# Patient Record
Sex: Male | Born: 1940 | Race: Black or African American | Hispanic: No | Smoking: Former smoker
Health system: Southern US, Community
[De-identification: ages and names within clinical notes are randomized; demographics above are authoritative.]

## PROBLEM LIST (undated history)

## (undated) DIAGNOSIS — I10 Essential (primary) hypertension: Secondary | ICD-10-CM

## (undated) DIAGNOSIS — F0390 Unspecified dementia without behavioral disturbance: Secondary | ICD-10-CM

## (undated) DIAGNOSIS — I739 Peripheral vascular disease, unspecified: Secondary | ICD-10-CM

## (undated) DIAGNOSIS — I96 Gangrene, not elsewhere classified: Secondary | ICD-10-CM

---

## 1998-05-01 ENCOUNTER — Emergency Department (HOSPITAL_COMMUNITY): Admission: EM | Admit: 1998-05-01 | Discharge: 1998-05-01 | Payer: Self-pay | Admitting: Emergency Medicine

## 2002-08-18 ENCOUNTER — Encounter: Payer: Self-pay | Admitting: Otolaryngology

## 2002-08-18 ENCOUNTER — Encounter: Admission: RE | Admit: 2002-08-18 | Discharge: 2002-08-18 | Payer: Self-pay | Admitting: Otolaryngology

## 2005-07-18 ENCOUNTER — Emergency Department (HOSPITAL_COMMUNITY): Admission: EM | Admit: 2005-07-18 | Discharge: 2005-07-18 | Payer: Self-pay | Admitting: Emergency Medicine

## 2005-07-28 ENCOUNTER — Ambulatory Visit: Payer: Self-pay | Admitting: Family Medicine

## 2005-09-09 ENCOUNTER — Emergency Department (HOSPITAL_COMMUNITY): Admission: EM | Admit: 2005-09-09 | Discharge: 2005-09-09 | Payer: Self-pay | Admitting: Family Medicine

## 2005-12-17 ENCOUNTER — Ambulatory Visit: Payer: Self-pay | Admitting: Family Medicine

## 2006-07-19 ENCOUNTER — Ambulatory Visit: Payer: Self-pay | Admitting: Internal Medicine

## 2006-08-02 ENCOUNTER — Emergency Department (HOSPITAL_COMMUNITY): Admission: EM | Admit: 2006-08-02 | Discharge: 2006-08-02 | Payer: Self-pay | Admitting: Emergency Medicine

## 2009-09-04 ENCOUNTER — Ambulatory Visit: Payer: Self-pay | Admitting: Physician Assistant

## 2009-09-04 DIAGNOSIS — I10 Essential (primary) hypertension: Secondary | ICD-10-CM | POA: Insufficient documentation

## 2009-09-04 DIAGNOSIS — J069 Acute upper respiratory infection, unspecified: Secondary | ICD-10-CM | POA: Insufficient documentation

## 2009-09-05 ENCOUNTER — Encounter: Payer: Self-pay | Admitting: Physician Assistant

## 2010-06-22 LAB — CONVERTED CEMR LAB
Albumin: 4.6 g/dL (ref 3.5–5.2)
Alkaline Phosphatase: 83 units/L (ref 39–117)
BUN: 15 mg/dL (ref 6–23)
Basophils Absolute: 0 10*3/uL (ref 0.0–0.1)
Calcium: 9.7 mg/dL (ref 8.4–10.5)
Eosinophils Relative: 9 % — ABNORMAL HIGH (ref 0–5)
Glucose, Bld: 79 mg/dL (ref 70–99)
HCT: 42.5 % (ref 39.0–52.0)
Hemoglobin: 14.1 g/dL (ref 13.0–17.0)
Lymphocytes Relative: 28 % (ref 12–46)
MCHC: 33.2 g/dL (ref 30.0–36.0)
MCV: 90.4 fL (ref 78.0–100.0)
Monocytes Absolute: 0.5 10*3/uL (ref 0.1–1.0)
Potassium: 4.3 meq/L (ref 3.5–5.3)
RDW: 15.2 % (ref 11.5–15.5)

## 2010-06-24 NOTE — Letter (Signed)
Summary: *HSN Results Follow up  [facility]  [address] [place], [place] [address]   Phone: [phone]  Fax: [phone]      09/05/2009   MURRIEL EIDEM [address] [place], [place]  [address]   Dear  Mr. Obaloluwa Laverdiere,                            ____S.Drinkard,FNP   ____D. Gore,FNP       ____B. McPherson,MD   ____V. Rankins,MD    ____E. Mulberry,MD    ____N. Daphine Deutscher, FNP  ____D. Reche Dixon, MD    ____K. Philipp Deputy, MD    __x__S. Alben Spittle, PA-C     This letter is to inform you that your recent test(s):  _______Pap Smear    ___x____Lab Test     _______X-ray    ___x____ is within acceptable limits  _______ requires a medication change  _______ requires a follow-up lab visit  _______ requires a follow-up visit with your provider   Comments:       _________________________________________________________ If you have any questions, please contact our office                     Sincerely,  Tereso Newcomer PA-C [facility]

## 2010-06-24 NOTE — Assessment & Plan Note (Signed)
Summary: Seth West PT//HTN////KT   Vital Signs:  Patient profile:   70 year old male Weight:      162.50 pounds Temp:     98 degrees F Pulse (ortho):   98 / minute Pulse rhythm:   regular Resp:     20 per minute BP sitting:   143 / 97  (right arm)  Vitals Entered By: Chauncy Passy, SMA CC: Pt. is here for HTN management. Pt. is also complaining of chest pains associated w/coughing that started today in the morning. Pt. when to the dentis on Monday and they prescribed him amoxicillin for an infection. , Hypertension Management Is Patient Diabetic? No Pain Assessment Patient in pain? yes     Location: chest Intensity: 2 Onset of pain  Intermittent  Does patient need assistance? Functional Status Self care Ambulation Normal   Primary Care Provider:  Tereso Newcomer, PA-C  CC:  Pt. is here for HTN management. Pt. is also complaining of chest pains associated w/coughing that started today in the morning. Pt. when to the dentis on Monday and they prescribed him amoxicillin for an infection.  and Hypertension Management.  History of Present Illness: Re-establish.  Not seen since 2008. Saw Dr. Barbaraann West in the past.  Remembers seeing Dr. Reche Dixon. Here with daughter [name].  HTN:  Out of BP meds for about 2 years.  Saw dentist recently and was noted to have very high BP.  No recording given today.  Decided to set up appt.  Dental:  Saw dentist and put on Amox.  Supposed to get teeth pulled but waiting on taking meds first.  Sees dentist back next week.  Amox start this week.  Cough:  Started today.  No fever.  Sputum white.  No hemoptysis.  No dyspnea.  Does have chest discomfort with cough. No sore throat or otalgia.  No wheezing.    Hypertension History:      He denies headache, orthopnea, and syncope.        Positive major cardiovascular risk factors include male age 44 years old or older and hypertension.  Negative major cardiovascular risk factors include non-tobacco-user status.      Habits & Providers  Alcohol-Tobacco-Diet     Alcohol drinks/day: 1 quart every day     Alcohol type: beer     Needs 'eye opener' in am: no     Tobacco Status: quit     Year Quit: 2010     Pack years: 20 +  Exercise-Depression-Behavior     Drug Use: no  Current Medications (verified): 1)  None  Allergies (verified): No Known Drug Allergies  Past History:  Past Medical History: Hypertension  Past Surgical History: Denies surgical history  Family History: Cousin - ? cancer Daughter - Hodgkins Lymphoma  Social History: Retired Former Smoker Alcohol use-yes Drug use-no Smoking Status:  quit Drug Use:  no  Review of Systems      See HPI GI:  Denies bloody stools. GU:  Denies hematuria.  Physical Exam  General:  alert, well-developed, and well-nourished.   Head:  normocephalic and atraumatic.   Eyes:  pupils equal, pupils round, pupils reactive to light, and no optic disk abnormalities.   Ears:  R ear normal and L ear normal.   Nose:  no external deformity.   Mouth:  pharynx pink and moist and no exudates.   Neck:  supple and no cervical lymphadenopathy.   Lungs:  normal breath sounds, no crackles, and no wheezes.   Heart:  normal rate and regular rhythm.   Abdomen:  soft and non-tender.   Neurologic:  alert & oriented X3 and cranial nerves II-XII intact.   Psych:  normally interactive.     Impression & Recommendations:  Problem # 1:  UPPER RESPIRATORY INFECTION (ICD-465.9)  rest  fluids tessalon perles as needed already on antibx for teeth . . . not needed for URI  His updated medication list for this problem includes:    Tessalon Perles 100 Mg Caps (Benzonatate) .Marland Kitchen... Take 1 tablet by mouth three times a day as needed for cough  Problem # 2:  HYPERTENSION (ICD-401.9)  start cardizem 120 mg once daily  Orders: EKG w/ Interpretation (93000) T-Comprehensive Metabolic Panel ([address]25-36644) T-CBC w/Diff (03474-25956) T-TSH ([address]756-43329)  His  updated medication list for this problem includes:    Cardizem 120 Mg Tabs (Diltiazem hcl) .Marland Kitchen... Take 1 tablet by mouth once a day for blood pressure  Problem # 3:  Preventive Health Care (ICD-V70.0) arrange CPE  Complete Medication List: 1)  Cardizem 120 Mg Tabs (Diltiazem hcl) .... Take 1 tablet by mouth once a day for blood pressure 2)  Tessalon Perles 100 Mg Caps (Benzonatate) .... Take 1 tablet by mouth three times a day as needed for cough  Hypertension Assessment/Plan:      The patient's hypertensive risk group is category B: At least one risk factor (excluding diabetes) with no target organ damage.  Today's blood pressure is 143/97.    Patient Instructions: 1)  Please schedule a follow-up appointment in 2 months with Ikaika Showers for CPE (come fasting for bloodwork). Do not eat or drink anything after midnight except water. 2)  Drink plenty of fluids. 3)  Get plenty of rest. 4)  Take tessalon perles three times a day as needed for cough. 5)  Use tylenol for aches and pains or fever. 6)  Take 650 - 1000 mg of tylenol every 4-6 hours as needed for relief of pain or comfort of fever. Avoid taking more than 4000 mg in a 24 hour period( can cause liver damage in higher doses).  7)  Go to emergency room if you develop a fever of 101 or higher. Prescriptions: TESSALON PERLES 100 MG CAPS (BENZONATATE) Take 1 tablet by mouth three times a day as needed for cough  #30 x 1   Entered and Authorized by:   Tereso Newcomer PA-C   Signed by:   Tereso Newcomer PA-C on 09/04/2009   Method used:   Print then Give to Patient   RxID:   [id number] CARDIZEM 120 MG TABS (DILTIAZEM HCL) Take 1 tablet by mouth once a day for blood pressure  #30 x 5   Entered and Authorized by:   Tereso Newcomer PA-C   Signed by:   Tereso Newcomer PA-C on 09/04/2009   Method used:   Print then Give to Patient   RxID:   [id number]    EKG  Procedure date:  09/04/2009  Findings:      Normal sinus rhythm with rate of:   93 normal axis LVH nonspecific ST-TW abnormality PR int 140 ms

## 2013-06-25 DIAGNOSIS — I96 Gangrene, not elsewhere classified: Secondary | ICD-10-CM

## 2013-06-25 HISTORY — DX: Gangrene, not elsewhere classified: I96

## 2013-07-05 ENCOUNTER — Encounter (HOSPITAL_COMMUNITY): Payer: Self-pay | Admitting: Emergency Medicine

## 2013-07-05 ENCOUNTER — Emergency Department (HOSPITAL_COMMUNITY): Payer: Medicare Other

## 2013-07-05 ENCOUNTER — Inpatient Hospital Stay (HOSPITAL_COMMUNITY)
Admission: EM | Admit: 2013-07-05 | Discharge: 2013-07-12 | DRG: 239 | Disposition: A | Discharge status: Skilled Nursing Facility | Payer: Medicare Other | Attending: Internal Medicine | Admitting: Internal Medicine

## 2013-07-05 DIAGNOSIS — E875 Hyperkalemia: Secondary | ICD-10-CM | POA: Diagnosis not present

## 2013-07-05 DIAGNOSIS — D62 Acute posthemorrhagic anemia: Secondary | ICD-10-CM | POA: Diagnosis not present

## 2013-07-05 DIAGNOSIS — Z87891 Personal history of nicotine dependence: Secondary | ICD-10-CM

## 2013-07-05 DIAGNOSIS — G547 Phantom limb syndrome without pain: Secondary | ICD-10-CM | POA: Diagnosis not present

## 2013-07-05 DIAGNOSIS — L97509 Non-pressure chronic ulcer of other part of unspecified foot with unspecified severity: Secondary | ICD-10-CM | POA: Diagnosis present

## 2013-07-05 DIAGNOSIS — I998 Other disorder of circulatory system: Secondary | ICD-10-CM

## 2013-07-05 DIAGNOSIS — E872 Acidosis: Secondary | ICD-10-CM | POA: Diagnosis present

## 2013-07-05 DIAGNOSIS — L0390 Cellulitis, unspecified: Secondary | ICD-10-CM

## 2013-07-05 DIAGNOSIS — I10 Essential (primary) hypertension: Secondary | ICD-10-CM | POA: Diagnosis present

## 2013-07-05 DIAGNOSIS — N170 Acute kidney failure with tubular necrosis: Secondary | ICD-10-CM | POA: Diagnosis not present

## 2013-07-05 DIAGNOSIS — M79609 Pain in unspecified limb: Secondary | ICD-10-CM

## 2013-07-05 DIAGNOSIS — I739 Peripheral vascular disease, unspecified: Principal | ICD-10-CM

## 2013-07-05 DIAGNOSIS — I96 Gangrene, not elsewhere classified: Secondary | ICD-10-CM

## 2013-07-05 DIAGNOSIS — N179 Acute kidney failure, unspecified: Secondary | ICD-10-CM

## 2013-07-05 DIAGNOSIS — R7402 Elevation of levels of lactic acid dehydrogenase (LDH): Secondary | ICD-10-CM

## 2013-07-05 DIAGNOSIS — E8729 Other acidosis: Secondary | ICD-10-CM | POA: Diagnosis present

## 2013-07-05 DIAGNOSIS — R7401 Elevation of levels of liver transaminase levels: Secondary | ICD-10-CM | POA: Diagnosis present

## 2013-07-05 DIAGNOSIS — A419 Sepsis, unspecified organism: Secondary | ICD-10-CM | POA: Diagnosis present

## 2013-07-05 DIAGNOSIS — I959 Hypotension, unspecified: Secondary | ICD-10-CM | POA: Diagnosis not present

## 2013-07-05 DIAGNOSIS — M7989 Other specified soft tissue disorders: Secondary | ICD-10-CM

## 2013-07-05 DIAGNOSIS — R7989 Other specified abnormal findings of blood chemistry: Secondary | ICD-10-CM | POA: Diagnosis present

## 2013-07-05 DIAGNOSIS — R740 Nonspecific elevation of levels of transaminase and lactic acid dehydrogenase [LDH]: Secondary | ICD-10-CM

## 2013-07-05 HISTORY — DX: Peripheral vascular disease, unspecified: I73.9

## 2013-07-05 HISTORY — DX: Essential (primary) hypertension: I10

## 2013-07-05 HISTORY — DX: Gangrene, not elsewhere classified: I96

## 2013-07-05 LAB — COMPREHENSIVE METABOLIC PANEL
ALBUMIN: 3 g/dL — AB (ref 3.5–5.2)
ALT: 97 U/L — ABNORMAL HIGH (ref 0–53)
AST: 80 U/L — AB (ref 0–37)
Alkaline Phosphatase: 232 U/L — ABNORMAL HIGH (ref 39–117)
BILIRUBIN TOTAL: 1 mg/dL (ref 0.3–1.2)
BUN: 23 mg/dL (ref 6–23)
CALCIUM: 10.3 mg/dL (ref 8.4–10.5)
CO2: 22 mEq/L (ref 19–32)
CREATININE: 1.08 mg/dL (ref 0.50–1.35)
Chloride: 96 mEq/L (ref 96–112)
GFR calc Af Amer: 76 mL/min — ABNORMAL LOW (ref >90)
GFR calc non Af Amer: 66 mL/min — ABNORMAL LOW (ref >90)
Glucose, Bld: 94 mg/dL (ref 70–99)
Potassium: 4.3 mEq/L (ref 3.7–5.3)
Sodium: 136 mEq/L — ABNORMAL LOW (ref 137–147)
TOTAL PROTEIN: 9.6 g/dL — AB (ref 6.0–8.3)

## 2013-07-05 LAB — CBC WITH DIFFERENTIAL/PLATELET
BASOS ABS: 0 10*3/uL (ref 0.0–0.1)
BASOS PCT: 0 % (ref 0–1)
EOS ABS: 0 10*3/uL (ref 0.0–0.7)
EOS PCT: 0 % (ref 0–5)
HEMATOCRIT: 39.5 % (ref 39.0–52.0)
HEMOGLOBIN: 14.6 g/dL (ref 13.0–17.0)
Lymphocytes Relative: 12 % (ref 12–46)
Lymphs Abs: 1.9 10*3/uL (ref 0.7–4.0)
MCH: 33.1 pg (ref 26.0–34.0)
MCHC: 37 g/dL — AB (ref 30.0–36.0)
MCV: 89.6 fL (ref 78.0–100.0)
MONO ABS: 1.2 10*3/uL — AB (ref 0.1–1.0)
MONOS PCT: 7 % (ref 3–12)
Neutro Abs: 12.8 10*3/uL — ABNORMAL HIGH (ref 1.7–7.7)
Neutrophils Relative %: 81 % — ABNORMAL HIGH (ref 43–77)
Platelets: 378 10*3/uL (ref 150–400)
RBC: 4.41 MIL/uL (ref 4.22–5.81)
RDW: 12.8 % (ref 11.5–15.5)
WBC: 15.9 10*3/uL — ABNORMAL HIGH (ref 4.0–10.5)

## 2013-07-05 LAB — CG4 I-STAT (LACTIC ACID): Lactic Acid, Venous: 1.68 mmol/L (ref 0.5–2.2)

## 2013-07-05 LAB — ETHANOL: Alcohol, Ethyl (B): <11 mg/dL (ref 0–11)

## 2013-07-05 LAB — CK: Total CK: 281 U/L — ABNORMAL HIGH (ref 7–232)

## 2013-07-05 MED ORDER — HEPARIN SODIUM (PORCINE) 5000 UNIT/ML IJ SOLN
5000.0000 [IU] | Freq: Three times a day (TID) | INTRAMUSCULAR | Status: DC
  Administered 2013-07-05 – 2013-07-12 (×17): 5000 [IU] via SUBCUTANEOUS
  Filled 2013-07-05 (×24): qty 1

## 2013-07-05 MED ORDER — PIPERACILLIN-TAZOBACTAM 3.375 G IVPB 30 MIN
3.3750 g | Freq: Once | INTRAVENOUS | Status: AC
  Administered 2013-07-05: 3.375 g via INTRAVENOUS
  Filled 2013-07-05 (×2): qty 50

## 2013-07-05 MED ORDER — DOCUSATE SODIUM 100 MG PO CAPS
100.0000 mg | ORAL_CAPSULE | Freq: Two times a day (BID) | ORAL | Status: DC
  Administered 2013-07-05 – 2013-07-12 (×14): 100 mg via ORAL
  Filled 2013-07-05 (×17): qty 1

## 2013-07-05 MED ORDER — SODIUM CHLORIDE 0.9 % IV SOLN
INTRAVENOUS | Status: AC
  Administered 2013-07-05: 500 mL via INTRAVENOUS
  Administered 2013-07-06: 05:00:00 via INTRAVENOUS

## 2013-07-05 MED ORDER — METOPROLOL TARTRATE 25 MG PO TABS
25.0000 mg | ORAL_TABLET | Freq: Two times a day (BID) | ORAL | Status: DC
  Administered 2013-07-05 – 2013-07-12 (×14): 25 mg via ORAL
  Filled 2013-07-05 (×16): qty 1

## 2013-07-05 MED ORDER — PIPERACILLIN-TAZOBACTAM 3.375 G IVPB
3.3750 g | Freq: Three times a day (TID) | INTRAVENOUS | Status: DC
  Administered 2013-07-05 – 2013-07-09 (×11): 3.375 g via INTRAVENOUS
  Filled 2013-07-05 (×15): qty 50

## 2013-07-05 MED ORDER — VANCOMYCIN HCL 10 G IV SOLR
1250.0000 mg | INTRAVENOUS | Status: DC
  Administered 2013-07-05 – 2013-07-07 (×3): 1250 mg via INTRAVENOUS
  Filled 2013-07-05 (×4): qty 1250

## 2013-07-05 MED ORDER — BIOTENE DRY MOUTH MT LIQD
15.0000 mL | Freq: Two times a day (BID) | OROMUCOSAL | Status: DC
  Administered 2013-07-05 – 2013-07-12 (×12): 15 mL via OROMUCOSAL

## 2013-07-05 MED ORDER — OXYCODONE HCL 5 MG PO TABS
5.0000 mg | ORAL_TABLET | ORAL | Status: DC | PRN
  Administered 2013-07-06 – 2013-07-12 (×5): 5 mg via ORAL
  Filled 2013-07-05 (×5): qty 1

## 2013-07-05 NOTE — ED Notes (Signed)
Lactic acid results shown to Dr. Pollina 

## 2013-07-05 NOTE — Progress Notes (Addendum)
ANTIBIOTIC CONSULT NOTE - INITIAL  Pharmacy Consult for Vancomycin and Zosyn Indication: Cellulitis  No Known Allergies  Patient Measurements:  Weight: 70kg (patient estimate)  Vital Signs: Temp: 98.4 F (36.9 C) (02/11 1219) Temp src: Oral (02/11 1219) BP: 157/82 mmHg (02/11 1500) Pulse Rate: 109 (02/11 1500) Intake/Output from previous day:   Intake/Output from this shift:    Labs:  Recent Labs  07/05/13 1328  WBC 15.9*  HGB 14.6  PLT 378  CREATININE 1.08   CrCl is unknown because there is no height on file for the current visit. No results found for this basename: VANCOTROUGH, VANCOPEAK, VANCORANDOM, GENTTROUGH, GENTPEAK, GENTRANDOM, TOBRATROUGH, TOBRAPEAK, TOBRARND, AMIKACINPEAK, AMIKACINTROU, AMIKACIN,  in the last 72 hours   Microbiology: No results found for this or any previous visit (from the past 720 hour(s)).  Medical History: Past Medical History  Diagnosis Date  . Hypertension     has been off meds for years.     Medications:  See electronic med rec  Assessment: 72yom to start Vancomycin and Zosyn for RLE ulcer/cellulitis.  - Afebrile, WBC ~16 - CrCl ~58  Goal of Therapy:  Vancomycin trough level 10-15 mcg/ml  Plan:  1. Vancomycin 1.25g IV q24h 2. Zosyn 3.375g IV q8h - infuse over 4 hours 3. Follow-up antibiotic plan, renal function, cultures and [facility]  [place], [place] [phone] 07/05/2013,4:21 PM

## 2013-07-05 NOTE — Progress Notes (Signed)
Pt admitted to [facility], received report from [place], [place]. Pt is alert and oriented to staff, call bell, and room. Bed in lowest position. Call bell within reach. Full assessment to Epic. Will continue to monitor. Fayne NorrieMackenzie Annalyse Langlais, RN

## 2013-07-05 NOTE — Consult Note (Signed)
[facility] CONSULT [facility]   MRN : [id number]  Reason for Consult: Right foot ulcer with Chronic ishemia Referring Physician: ED  History of Present Illness: 73 y/o male has reported having a foot wound that has progressed over the past 2 weeks and his daughter called EMS to bring him to the hospital today.  He has been washing the foot with alcohol.  He denise DM, stroke, or hyperlipidemia.  He does have hypertension according to his daughter, but does not take any medications.  He does not go to the doctor on a regular basis. He has a history of smoking.     Current Facility-Administered Medications  Medication Dose Route Frequency Provider Last Rate Last Dose  . piperacillin-tazobactam (ZOSYN) IVPB 3.375 g  3.375 g Intravenous Once Gilda Creasehristopher J. Pollina, MD       No current outpatient prescriptions on file.    Pt meds include: Statin :No Betablocker: No ASA: No Other anticoagulants/antiplatelets:   Past Medical History  Diagnosis Date  . Hypertension     has been off meds for years.     History reviewed. No pertinent past surgical history. Dental procedures  Social History History  Substance Use Topics  . Smoking status: Not on file  . Smokeless tobacco: Not on file  . Alcohol Use: Not on file    Family History Cousin - ? cancer  Daughter - Hodgkins Lymphoma  No Known Allergies   REVIEW OF SYSTEMS  General: [ ]  Weight loss, [ ]  Fever, [ ]  chills Neurologic: [ ]  Dizziness, [ ]  Blackouts, [ ]  Seizure [ ]  Stroke, [ ]  "Mini stroke", [ ]  Slurred speech, [ ]  Temporary blindness; [ ]  weakness in arms or legs, [ ]  Hoarseness [ ]  Dysphagia Cardiac: [ ]  Chest pain/pressure, [ ]  Shortness of breath at rest [ ]  Shortness of breath with exertion, [ ]  Atrial fibrillation or irregular heartbeat  Vascular: [ ]  Pain in legs with walking, [x ] Pain in legs at rest, [ ]  Pain in legs at night,  [ ]  Non-healing ulcer, [ ]  Blood clot in vein/DVT,    Pulmonary: [ ]  Home oxygen, [ ]  Productive cough, [ ]  Coughing up blood, [ ]  Asthma,  [ ]  Wheezing [ ]  COPD Musculoskeletal:  [ ]  Arthritis, [ ]  Low back pain, [ ]  Joint pain Hematologic: [ ]  Easy Bruising, [ ]  Anemia; [ ]  Hepatitis Gastrointestinal: [ ]  Blood in stool, [ ]  Gastroesophageal Reflux/heartburn, Urinary: [ ]  chronic Kidney disease, [ ]  on HD - [ ]  MWF or [ ]  TTHS, [ ]  Burning with urination, [ ]  Difficulty urinating Skin: [ ]  Rashes, [x ] Wounds  Psychological: [ ]  Anxiety, [ ]  Depression  Physical Examination Filed Vitals:   07/05/13 1219 07/05/13 1500  BP: 160/78 157/82  Pulse: 114 109  Temp: 98.4 F (36.9 C)   TempSrc: Oral   Resp: 18 18  SpO2: 97% 99%   There is no height or weight on file to calculate BMI.  General:  NAD HENT: WNL Eyes: Pupils equal, cataracts Pulmonary: normal non-labored breathing , without Rales, rhonchi,  wheezing Cardiac: RRR, without  Murmurs, rubs or gallops; No carotid bruits Abdomen: soft, NT, no masses Skin: no rashes, ulcers noted;  positve Gangrene , positive cellulitis; positive open wounds;   Right foot and leg are erythematous with edema, plantar foot blister, black necrotic toes skin feels warm to touch.  Vascular Exam/Pulses:Femoral pulses palpable, no popliteal.  Doppler faint  AT signal bil. Radial pulses palpable bil.   Musculoskeletal: positive muscle wasting left foot and ankle with atrophy.  Neurologic: A&O X 3; Appropriate Affect ;  SENSATION: decreased bil. LE Right > left MOTOR FUNCTION: limited exam Bil. LE, Upper ext. 5/5 grossly equal Speech is fluent/normal   Significant Diagnostic Studies: CBC Lab Results  Component Value Date   WBC 15.9* 07/05/2013   HGB 14.6 07/05/2013   HCT 39.5 07/05/2013   MCV 89.6 07/05/2013   PLT 378 07/05/2013    BMET    Component Value Date/Time   NA 136* 07/05/2013 1328   K 4.3 07/05/2013 1328   CL 96 07/05/2013 1328   CO2 22 07/05/2013 1328   GLUCOSE 94 07/05/2013 1328    BUN 23 07/05/2013 1328   CREATININE 1.08 07/05/2013 1328   CALCIUM 10.3 07/05/2013 1328   GFRNONAA 66* 07/05/2013 1328   GFRAA 76* 07/05/2013 1328   CrCl is unknown because there is no height on file for the current visit.  COAG No results found for this basename: INR, PROTIME     Non-Invasive Vascular Imaging:  Pending ABI, venous duplex  ASSESSMENT/PLAN:  Infected/chronic ischemic right foot It is likely he with need a right BKA verses AKA.   Clinton Gallant [facility] 07/05/2013 3:25 PM  Agree with above assessment Patient states this has been present for 2 weeks but probably much longer He has 3+ popliteal pulse palpable in right lower extremity with nonsalvageable right foot  Needs right below knee amputation-discussed with patient and his daughter Patient on vancomycin and Zosyn Plan surgery for Friday morning

## 2013-07-05 NOTE — Progress Notes (Signed)
*  PRELIMINARY RESULTS* Vascular Ultrasound Right lower extremity venous duplex has been completed.  Preliminary findings: no evidence of DVT.  ABI completed:   RIGHT    LEFT    PRESSURE WAVEFORM  PRESSURE WAVEFORM  BRACHIAL 180 Tri BRACHIAL 182 Tri  DP   DP    AT 97 Mono  AT 130 Mono   PT 92 Mono  PT unaudible   PER   PER    GREAT TOE  NA GREAT TOE  NA    RIGHT LEFT  ABI 0.53 0.71    Farrel DemarkJill Eunice, RDMS, RVT  07/05/2013, 4:13 PM

## 2013-07-05 NOTE — ED Notes (Signed)
[phone] Delray AltMargie (daughter) to be updated once pt has room assignment.

## 2013-07-05 NOTE — ED Notes (Signed)
Per EMS pt came from home with wound to right foot. Pt reports wound has been there for approximately 2 weeks but family reports possibly 2 months. Foot is white on top, 2nd and 3rd toe are black, there is a large blister to the bottom of the foot. Pitting edema extends to knee. Family reports pt has hx of HTN but has been off medication for years, family is unsure of hx of DM.

## 2013-07-05 NOTE — ED Provider Notes (Signed)
CSN: [id number]     Arrival date & time 07/05/13  1209 History   First MD Initiated Contact with Patient 07/05/13 1211     Chief Complaint  Patient presents with  . Wound Infection     (Consider location/radiation/quality/duration/timing/severity/associated sxs/prior Treatment) HPI Comments: Patient presents to the ER from home for evaluation of a wound on his right foot. Patient reports that he injured his foot several weeks ago. He says he bumped it on a piece of furniture. The patient is an extremely vague historian and getting the complete history is very difficult.  The patient's family apparently became concerned about his foot today when they saw her and called an ambulance. Patient reports a large wound on the top of his foot with color changes of the toes. He denies pain, however.   Patient  Past Medical History  Diagnosis Date  . Hypertension     has been off meds for years.    History reviewed. No pertinent past surgical history. No family history on file. History  Substance Use Topics  . Smoking status: Not on file  . Smokeless tobacco: Not on file  . Alcohol Use: Not on file    Review of Systems  Respiratory: Negative for shortness of breath.   Cardiovascular: Negative for chest pain.  Skin: Positive for color change and wound.  All other systems reviewed and are negative.      Allergies  Review of patient's allergies indicates no known allergies.  Home Medications  No current outpatient prescriptions on file. BP 157/82  Pulse 109  Temp(Src) 98.4 F (36.9 C) (Oral)  Resp 18  SpO2 99% Physical Exam  Constitutional: He is oriented to person, place, and time. He appears well-developed and well-nourished. No distress.  HENT:  Head: Normocephalic and atraumatic.  Right Ear: Hearing normal.  Left Ear: Hearing normal.  Nose: Nose normal.  Mouth/Throat: Oropharynx is clear and moist and mucous membranes are normal.  Eyes: Conjunctivae and EOM are  normal. Pupils are equal, round, and reactive to light.  Neck: Normal range of motion. Neck supple.  Cardiovascular: Regular rhythm, S1 normal and S2 normal.  Exam reveals no gallop and no friction rub.   No murmur heard. Pulses:      Dorsalis pedis pulses are 0 on the right side.  Right DP not palpable and can not find with doppler Right PT not palpable, faintly present with doppler  Pulmonary/Chest: Effort normal and breath sounds normal. No respiratory distress. He exhibits no tenderness.  Abdominal: Soft. Normal appearance and bowel sounds are normal. There is no hepatosplenomegaly. There is no tenderness. There is no rebound, no guarding, no tenderness at McBurney's point and negative Murphy's sign. No hernia.  Musculoskeletal: Normal range of motion. He exhibits edema (right leg).  Neurological: He is alert and oriented to person, place, and time. He has normal strength. No cranial nerve deficit or sensory deficit. Coordination normal. GCS eye subscore is 4. GCS verbal subscore is 5. GCS motor subscore is 6.  Skin: Skin is warm, dry and intact. No rash noted. No cyanosis.  Document second, third, fourth, fifth toes on the right foot with dorsal foot wound. Malodorous drainage present. Erythema present as well, but no warmth, foot is cool to touch.  Psychiatric: He has a normal mood and affect. His speech is normal and behavior is normal. Thought content normal.    ED Course  Procedures (including critical care time) Labs Review Labs Reviewed  CBC WITH DIFFERENTIAL - Abnormal;  Notable for the following:    WBC 15.9 (*)    MCHC 37.0 (*)    Neutrophils Relative % 81 (*)    Neutro Abs 12.8 (*)    Monocytes Absolute 1.2 (*)    All other components within normal limits  COMPREHENSIVE METABOLIC PANEL - Abnormal; Notable for the following:    Sodium 136 (*)    Total Protein 9.6 (*)    Albumin 3.0 (*)    AST 80 (*)    ALT 97 (*)    Alkaline Phosphatase 232 (*)    GFR calc non Af Amer  66 (*)    GFR calc Af Amer 76 (*)    All other components within normal limits  CK - Abnormal; Notable for the following:    Total CK 281 (*)    All other components within normal limits  CULTURE, BLOOD (ROUTINE X 2)  CULTURE, BLOOD (ROUTINE X 2)  CG4 I-STAT (LACTIC ACID)   Imaging Review Dg Foot Complete Right  07/05/2013   CLINICAL DATA:  Wound infection.  EXAM: RIGHT FOOT COMPLETE - 3+ VIEW  COMPARISON:  None.  FINDINGS: There is no fracture or dislocation or bone destruction. Minimal degenerative changes are present at the first metatarsal phalangeal joint. Osseous structures are otherwise normal. Small vessel calcification is noted in the foot. Minimal osteophyte formation at the anterior aspect of the distal tibia.  IMPRESSION: No acute abnormality. Specifically, no findings suggestive of osteomyelitis.   Electronically Signed   By: Geanie CooleyJim  Maxwell M.D.   On: 07/05/2013 14:02    EKG Interpretation   None              MDM   Final diagnoses:  Gangrene of toe  Cellulitis    Patient presents to the ER for evaluation of wound on the right foot. Examination reveals gangrene of second through fifth toes of the right foot with significant swelling, erythema associated with infection. He also has significantly diminished blood flow to the leg. Vascular surgery has been constant, patient will have ABIs and venous duplex. He will be admitted to the medicine service for antibiotics and further management.    Gilda Creasehristopher J. Pollina, MD 07/05/13 1536

## 2013-07-05 NOTE — H&P (Signed)
Date: 07/05/2013               Patient Name:  Seth West MRN: [id number]  DOB: [birth year] Age / Sex: 73 y.o., male   PCP: Pcp Not In System         Medical Service: Internal Medicine Teaching Service         Attending Physician: Dr. Karren Cobble, MD    First Contact: Dr. Ronnald Ramp Pager: [phone]  Second Contact: Dr. Eulas Post Pager: [phone]       After Hours (After 5p/  First Contact Pager: [phone]  weekends / holidays): Second Contact Pager: [phone]   Chief Complaint: Right foot wound  History of Present Illness: Seth West is a 73 y.o. male w/ PMHx of HTN (not on any medications), presents to the ED w/ complaints of a worsening right foot wound. Patient claims that he was in his kitchen 2-3 weeks ago and bumped his foot on a dresser drawer, sustaining a minor wound to the dorsum of his foot at that time. Since then, he claims the wound did not heal and continued to spread across his foot. He progressively noticed a change in color, worsening pain, and decreased sensation. Patient denies any history of CVA, recent change in vision, and denies a h/o DM or CAD. The patient also claims he has never had a non-healing wound like this before. The patient has a h/o smoking, but claims he quit 2 years ago when his friend passed away from lung cancer.  The patient otherwise denies recent fever, chills, nausea, vomiting, diarrhea, abdominal pain, SOB, chest pain, dizziness, lightheadedness, or LOC.   Meds: Current Facility-Administered Medications  Medication Dose Route Frequency Provider Last Rate Last Dose  . piperacillin-tazobactam (ZOSYN) IVPB 3.375 g  3.375 g Intravenous Once Orpah Greek, MD 100 mL/hr at 07/05/13 1632 3.375 g at 07/05/13 1632  . vancomycin (VANCOCIN) 1,250 mg in sodium chloride 0.9 % 250 mL IVPB  1,250 mg Intravenous Q24H [facility] [place], [facility]       No current outpatient prescriptions on file.    Allergies: Allergies as of 07/05/2013  .  (No Known Allergies)   Past Medical History  Diagnosis Date  . Hypertension     has been off meds for years.    History reviewed. No pertinent past surgical history. No family history on file. History   Social History  . Marital Status: Married    Spouse Name: N/A    Number of Children: N/A  . Years of Education: N/A   Occupational History  . Not on file.   Social History Main Topics  . Smoking status: Not on file  . Smokeless tobacco: Not on file  . Alcohol Use: Not on file  . Drug Use: Not on file  . Sexual Activity: Not on file   Other Topics Concern  . Not on file   Social History Narrative  . No narrative on file   Review of Systems: General: Denies fever, chills, diaphoresis, appetite change and fatigue.  Respiratory: Denies SOB, DOE, cough, chest tightness, and wheezing.   Cardiovascular: Denies chest pain, palpitations. Gastrointestinal: Denies nausea, vomiting, abdominal pain, diarrhea, constipation, blood in stool and abdominal distention.  Genitourinary: Denies dysuria, urgency, frequency, hematuria, flank pain and difficulty urinating.  Endocrine: Denies hot or cold intolerance, sweats, polyuria, polydipsia. Musculoskeletal: Positive for right foot pain, odor, color change, and loss of sensation. Denies myalgias, back pain, joint swelling, arthralgias and gait problem.  Skin: Positive  for skin changesin RLE. Denies pallor, rash and wounds.  Neurological: Positive for loss of sensation in RLE. Denies dizziness, seizures, syncope, weakness, lightheadedness, numbness and headaches.  Psychiatric/Behavioral: Denies mood changes, confusion, nervousness, sleep disturbance and agitation.  Physical Exam: Filed Vitals:   07/05/13 1219 07/05/13 1500  BP: 160/78 157/82  Pulse: 114 109  Temp: 98.4 F (36.9 C)   TempSrc: Oral   Resp: 18 18  SpO2: 97% 99%  General: Vital signs reviewed.  Patient is an elderly male, in no acute distress and cooperative with exam.    Head: Normocephalic and atraumatic. Eyes: R pupil (2 mm) > L pupil (4 mm). Both RRL. Senile arcus bilaterally. Vision generally intact.  Neck: Supple, trachea midline, normal ROM, No JVD, masses, thyromegaly, or carotid bruit present.  Cardiovascular: Tachycardic, regular rhythm, S1 normal, S2 normal, no murmurs, gallops, or rubs. Pulmonary/Chest: Normal respiratory effort, CTAB, no wheezes, rales, or rhonchi. Abdominal: Soft, non-tender, non-distended, bowel sounds are normal, no masses, organomegaly, or guarding present.  Extremities: RLE w/ foul odor, large wound on the dorsum of the foot (10 cm x 5 cm), w/ surrounding necrotic skin. Entire foot cool to the touch, no distal pulse. Popliteal pulse present. 3rd-5th toes black in color. +2 pitting edema in the RLE. LLE w/out ulcers or lesions. Distal pulses intact. No edema.  Neurological: A&O x3, Strength is normal and symmetric bilaterally, cranial nerve II-XII are grossly intact, no focal motor deficit. Sensory losses in the RLE, mostly involving the foot.  Skin: Warm, dry and intact. No rashes or erythema. See above.  Psychiatric: Normal mood and affect. speech and behavior is normal. Cognition and memory are normal.   Lab results: Basic Metabolic Panel:  Recent Labs  07/05/13 1328  NA 136*  K 4.3  CL 96  CO2 22  GLUCOSE 94  BUN 23  CREATININE 1.08  CALCIUM 10.3   Liver Function Tests:  Recent Labs  07/05/13 1328  AST 80*  ALT 97*  ALKPHOS 232*  BILITOT 1.0  PROT 9.6*  ALBUMIN 3.0*   CBC:  Recent Labs  07/05/13 1328  WBC 15.9*  NEUTROABS 12.8*  HGB 14.6  HCT 39.5  MCV 89.6  PLT 378   Cardiac Enzymes:  Recent Labs  07/05/13 1328  CKTOTAL 281*   Hemoglobin A1C: No results found for this basename: HGBA1C,  in the last 72 hours Fasting Lipid Panel: No results found for this basename: CHOL, HDL, LDLCALC, TRIG, CHOLHDL, LDLDIRECT,  in the last 72 hours  Imaging results:  Dg Foot Complete  Right  07/05/2013   CLINICAL DATA:  Wound infection.  EXAM: RIGHT FOOT COMPLETE - 3+ VIEW  COMPARISON:  None.  FINDINGS: There is no fracture or dislocation or bone destruction. Minimal degenerative changes are present at the first metatarsal phalangeal joint. Osseous structures are otherwise normal. Small vessel calcification is noted in the foot. Minimal osteophyte formation at the anterior aspect of the distal tibia.  IMPRESSION: No acute abnormality. Specifically, no findings suggestive of osteomyelitis.   Electronically Signed   By: Rozetta Nunnery M.D.   On: 07/05/2013 14:02   Other results: EKG: pending  Assessment & Plan by Problem: Seth West is a 73 y.o. male w/ PMHx of HTN, presents to the ED w/ complaints of worsening right foot wound, admitted for RLE gangrene.  RLE Gangrene- Patient w/ large wound on the dorsum of the foot, w/ surrounding necrotic tissue. Foot cool to the touch w/ significant color changes, 3rd-5th toes  black in color. No distal pulses present in RLE below popliteal pulse. +2 pitting edema. Mild pain present on exam. Patient w/ leukocytosis of 15.9, lactic acid of 1.68. tachycardic from 100-120 on exam, fulfilling SIRS criteria. Started on Vancomycin and Zosyn in the ED. LE doppler performed in the ED, no evidence of DVT, ABI 0.53 in the RLE, 0.71 in the LLE, significant for moderate to severe PAD. Vascular surgery consulted in ED, planning for BKA on Friday 07/05/13.  -Admitted to med-surg -Continue Vancomycin + Zosyn -Trend CBC -Oxycodone 5 mg q4h prn -VVS to follow, BKA on Friday.   Peripheral Vascular Disease- Patient w/ h/o HTN, but denies DM, CAD, or previous CVA. Admits to a h/o smoking but claims that he quit 2 years ago after his friend dies from lung cancer. Not currently on medications for HTN, BP 150-160's/80-90's. ABI in ED significant for moderate to severe peripheral artery disease in both extremities, R>L, 0.53 and 0.71, respectively. Doppler  negative for DVT. Vision grossly intact. Cr within normal limits.  -HbA1c  -Lipid profile -VVS on board as above  Transaminitis- Patient w/ no previous h/o liver disease, denies a recent h/o alcohol abuse. In ED, found to have AST of 80, ALT of 97, Alk phos of 232. Patient denies any abdominal pain, nausea, or vomiting. Not jaundiced on exam, no RUQ tenderness.  -Repeat CMP in AM -Hepatitis panel + HIV in AM -EtOh + UDS pending -Lipid panel -Will consider imaging or RUQ [place] if LFT's continue to rise  HTN- Patient w/ h/o HTN, not on any medications at home. BP 150-160's/80-90's in ED. Tachycardia on exam, 100-120.  -EKG -Start Metoprolol 25 mg bid for now.  -Continue to monitor  DVT/PE PPx- Heparin   Dispo: Disposition is deferred at this time, awaiting improvement of current medical problems. Anticipated discharge in approximately 2-3 day(s).   The patient does not have a current PCP (Pcp Not In System) and does need an [facility] hospital follow-up appointment after discharge.  The patient does not have transportation limitations that hinder transportation to clinic appointments.  Signed: Corky Sox, MD 07/05/2013, 4:37 PM  Pager: [phone]

## 2013-07-05 NOTE — ED Notes (Signed)
Pt undressed, in gown, continuous bp cuff and pulse ox 

## 2013-07-05 NOTE — ED Notes (Signed)
Pt taken to have vascular study done

## 2013-07-05 NOTE — ED Notes (Addendum)
Vascular PA's at bedside.

## 2013-07-06 LAB — COMPREHENSIVE METABOLIC PANEL
ALT: 67 U/L — ABNORMAL HIGH (ref 0–53)
AST: 50 U/L — ABNORMAL HIGH (ref 0–37)
Albumin: 2.3 g/dL — ABNORMAL LOW (ref 3.5–5.2)
Alkaline Phosphatase: 196 U/L — ABNORMAL HIGH (ref 39–117)
BILIRUBIN TOTAL: 1.1 mg/dL (ref 0.3–1.2)
BUN: 19 mg/dL (ref 6–23)
CALCIUM: 9.5 mg/dL (ref 8.4–10.5)
CO2: 20 mEq/L (ref 19–32)
Chloride: 98 mEq/L (ref 96–112)
Creatinine, Ser: 0.95 mg/dL (ref 0.50–1.35)
GFR calc Af Amer: >90 mL/min (ref >90)
GFR calc non Af Amer: 80 mL/min — ABNORMAL LOW (ref >90)
Glucose, Bld: 97 mg/dL (ref 70–99)
Potassium: 4.3 mEq/L (ref 3.7–5.3)
Sodium: 135 mEq/L — ABNORMAL LOW (ref 137–147)
TOTAL PROTEIN: 7.9 g/dL (ref 6.0–8.3)

## 2013-07-06 LAB — CBC
HCT: 34.7 % — ABNORMAL LOW (ref 39.0–52.0)
Hemoglobin: 12.6 g/dL — ABNORMAL LOW (ref 13.0–17.0)
MCH: 32.3 pg (ref 26.0–34.0)
MCHC: 36.3 g/dL — AB (ref 30.0–36.0)
MCV: 89 fL (ref 78.0–100.0)
Platelets: 378 10*3/uL (ref 150–400)
RBC: 3.9 MIL/uL — ABNORMAL LOW (ref 4.22–5.81)
RDW: 12.8 % (ref 11.5–15.5)
WBC: 14.2 10*3/uL — ABNORMAL HIGH (ref 4.0–10.5)

## 2013-07-06 LAB — LIPID PANEL
Cholesterol: 107 mg/dL (ref 0–200)
HDL: 45 mg/dL (ref >39)
LDL Cholesterol: 51 mg/dL (ref 0–99)
TRIGLYCERIDES: 53 mg/dL (ref <150)
Total CHOL/HDL Ratio: 2.4 RATIO
VLDL: 11 mg/dL (ref 0–40)

## 2013-07-06 LAB — HEMOGLOBIN A1C
Hgb A1c MFr Bld: 5.4 % (ref <5.7)
Mean Plasma Glucose: 108 mg/dL (ref <117)

## 2013-07-06 LAB — HEPATITIS PANEL, ACUTE
HCV AB: NEGATIVE
Hep A IgM: NONREACTIVE
Hep B C IgM: NONREACTIVE
Hepatitis B Surface Ag: NEGATIVE

## 2013-07-06 LAB — HIV ANTIBODY (ROUTINE TESTING W REFLEX): HIV: NONREACTIVE

## 2013-07-06 MED ORDER — AMLODIPINE BESYLATE 5 MG PO TABS
5.0000 mg | ORAL_TABLET | Freq: Every day | ORAL | Status: DC
  Administered 2013-07-06 – 2013-07-08 (×3): 5 mg via ORAL
  Filled 2013-07-06 (×3): qty 1

## 2013-07-06 NOTE — Progress Notes (Signed)
Subjective:  Patient seen at bedside this AM. Complains of pain in the RLE this morning, no worse than before. Denies any fever, chills, nausea, or vomiting.   Objective: Vital signs in last 24 hours: Filed Vitals:   07/05/13 1826 07/05/13 2106 07/06/13 0412 07/06/13 0823  BP: 158/73 169/77 172/92 169/83  Pulse: 108 85 96 96  Temp:  98.2 F (36.8 C) 97.8 F (36.6 C) 99.2 F (37.3 C)  TempSrc:  Oral Oral Oral  Resp:  $Remo'16 18 19  'yFWCr$ Weight: 159 lb 2.8 oz (72.2 kg) 160 lb 0.9 oz (72.6 kg)    SpO2: 100% 98% 97% 94%   Weight change:   Intake/Output Summary (Last 24 hours) at 07/06/13 1106 Last data filed at 07/06/13 8270  Gross per 24 hour  Intake 1651.25 ml  Output      0 ml  Net 1651.25 ml   Physical Exam: General: Alert, cooperative, and in no apparent distress HEENT: Vision grossly intact, oropharynx clear and non-erythematous  Neck: Full range of motion without pain, supple, no lymphadenopathy or carotid bruits Lungs: Clear to ascultation bilaterally, normal work of respiration, no wheezes, rales, ronchi Heart: Regular rate and rhythm, no murmurs, gallops, or rubs Abdomen: Soft, non-tender, non-distended, normal bowel sounds Extremities: RLE w/ foul odor, large wound on the dorsum of the foot (10 cm x 5 cm), w/ surrounding necrotic skin. Entire foot cool to the touch, no distal pulse. Popliteal pulse present. 3rd-5th toes black in color. +2 pitting edema in the RLE. LLE w/out ulcers or lesions. Distal pulses intact. No edema.  Neurologic: Alert & oriented X3, cranial nerves II-XII intact, Sensory losses in the RLE, mostly involving the foot.    Lab Results: Basic Metabolic Panel:  Recent Labs Lab 07/05/13 1328 07/06/13 0502  NA 136* 135*  K 4.3 4.3  CL 96 98  CO2 22 20  GLUCOSE 94 97  BUN 23 19  CREATININE 1.08 0.95  CALCIUM 10.3 9.5   Liver Function Tests:  Recent Labs Lab 07/05/13 1328 07/06/13 0502  AST 80* 50*  ALT 97* 67*  ALKPHOS 232* 196*  BILITOT  1.0 1.1  PROT 9.6* 7.9  ALBUMIN 3.0* 2.3*   CBC:  Recent Labs Lab 07/05/13 1328 07/06/13 0502  WBC 15.9* 14.2*  NEUTROABS 12.8*  --   HGB 14.6 12.6*  HCT 39.5 34.7*  MCV 89.6 89.0  PLT 378 378   Cardiac Enzymes:  Recent Labs Lab 07/05/13 1328  CKTOTAL 281*   Hemoglobin A1C:  Recent Labs Lab 07/06/13 0502  HGBA1C 5.4    Fasting Lipid Panel:  Recent Labs Lab 07/06/13 0502  CHOL 107  HDL 45  LDLCALC 51  TRIG 53  CHOLHDL 2.4   Coagulation: No results found for this basename: LABPROT, INR,  in the last 168 hours  Urine Drug Screen: Drugs of Abuse  No results found for this basename: labopia,  cocainscrnur,  labbenz,  amphetmu,  thcu,  labbarb    Alcohol Level:  Recent Labs Lab 07/05/13 2027  ETH <11   Urinalysis: No results found for this basename: COLORURINE, APPERANCEUR, LABSPEC, [place], GLUCOSEU, HGBUR, BILIRUBINUR, KETONESUR, PROTEINUR, UROBILINOGEN, NITRITE, LEUKOCYTESUR,  in the last 168 hours  Micro Results: Recent Results (from the past 240 hour(s))  CULTURE, BLOOD (ROUTINE X 2)     Status: None   Collection Time    07/05/13  1:20 PM      Result Value Ref Range Status   Specimen Description BLOOD HAND RIGHT  Final   Special Requests BOTTLES DRAWN AEROBIC ONLY 10CC   Final   Culture  Setup Time     Final   Value: 07/05/2013 21:02     Performed at [facility]   Culture     Final   Value:        BLOOD CULTURE RECEIVED NO GROWTH TO DATE CULTURE WILL BE HELD FOR 5 DAYS BEFORE ISSUING A FINAL NEGATIVE REPORT     Performed at [facility]   Report Status PENDING   Incomplete  CULTURE, BLOOD (ROUTINE X 2)     Status: None   Collection Time    07/05/13  1:30 PM      Result Value Ref Range Status   Specimen Description BLOOD RIGHT FOREARM   Final   Special Requests     Final   Value: BOTTLES DRAWN AEROBIC AND ANAEROBIC 10CCBLUE  5CCRED   Culture  Setup Time     Final   Value: 07/05/2013 21:03     Performed at [facility]   Culture     Final   Value:        BLOOD CULTURE RECEIVED NO GROWTH TO DATE CULTURE WILL BE HELD FOR 5 DAYS BEFORE ISSUING A FINAL NEGATIVE REPORT     Performed at [facility]   Report Status PENDING   Incomplete    Studies/Results: Dg Foot Complete Right  07/05/2013   CLINICAL DATA:  Wound infection.  EXAM: RIGHT FOOT COMPLETE - 3+ VIEW  COMPARISON:  None.  FINDINGS: There is no fracture or dislocation or bone destruction. Minimal degenerative changes are present at the first metatarsal phalangeal joint. Osseous structures are otherwise normal. Small vessel calcification is noted in the foot. Minimal osteophyte formation at the anterior aspect of the distal tibia.  IMPRESSION: No acute abnormality. Specifically, no findings suggestive of osteomyelitis.   Electronically Signed   By: Rozetta Nunnery M.D.   On: 07/05/2013 14:02   Medications: I have reviewed the patient's current medications. Scheduled Meds: . amLODipine  5 mg Oral Daily  . antiseptic oral rinse  15 mL Mouth Rinse BID  . docusate sodium  100 mg Oral BID  . heparin  5,000 Units Subcutaneous 3 times per day  . metoprolol tartrate  25 mg Oral BID  . piperacillin-tazobactam (ZOSYN)  IV  3.375 g Intravenous Q8H  . vancomycin  1,250 mg Intravenous Q24H   Continuous Infusions:  PRN Meds:.oxyCODONE  Assessment/Plan: Seth West is a 73 y.o. male w/ PMHx of HTN, presents to the ED w/ complaints of worsening right foot wound, admitted for RLE gangrene.   RLE Gangrene- Patient w/ large wound on the dorsum of the foot, w/ surrounding necrotic tissue. Foot cool to the touch w/ significant color changes, 3rd-5th toes black in color. No distal pulses present on exam in RLE below popliteal pulse. +2 pitting edema. Mild pain present on exam. Patient w/ leukocytosis of 15.9, improved to 14.2 today. Lactic acid of 1.68 on admission. Started on Vancomycin and Zosyn in the ED. LE doppler showed no evidence of DVT, ABI  0.53 in the RLE, 0.71 in the LLE, significant for moderate to severe PAD. Vascular surgery planning for BKA on Friday 07/05/13.  -Continue Vancomycin + Zosyn  -Trend CBC  -Blood cultures negative to date -Oxycodone 5 mg q4h prn  -VVS to follow, BKA on Friday.   Peripheral Vascular Disease- Patient w/ h/o HTN, but denies DM, CAD, or previous CVA.  Admits to a h/o smoking but claims that he quit 2 years ago after his friend dies from lung cancer. Not currently on medications for HTN, BP 150-160's/80-90's. ABI in ED significant for moderate to severe peripheral artery disease in both extremities. Lipid profile completely wnl. -HbA1c still pending -VVS on board as above   Transaminitis- Improving. Patient w/ no previous h/o liver disease, denies a recent h/o alcohol abuse. In ED, found to have AST of 80, ALT of 97, Alk phos of 232. Repeat CMP this morning shows improvement in LFT's. EtOh negative, acute hepatitis panel and HIV negative. No complaints of RUQ abdominal pain.  -Continue to follow  HTN- Patient w/ h/o HTN, not on any medications at home. BP 150-160's/80-90's in ED. Started on Metoprolol 25 mg bid w/ minimal effect on BP.  -Continue Metoprolol 25 mg bid for now.  -Start Norvasc 5 mg po qd -Continue to monitor    DVT/PE PPx- Heparin [place]  Dispo: Disposition is deferred at this time, awaiting improvement of current medical problems.  Anticipated discharge in approximately 3-4 day(s).   The patient does not have a current PCP (Pcp Not In System) and does need an [facility] hospital follow-up appointment after discharge.  The patient does not have transportation limitations that hinder transportation to clinic appointments.  Services Needed at time of discharge: Y = Yes, Blank = No PT:   OT:   RN:   Equipment:   Other:     LOS: 1 day   Corky Sox, MD 07/06/2013, 11:06 AM

## 2013-07-06 NOTE — H&P (Signed)
Internal Medicine Attending Admission Note Date: 07/06/2013  Patient name: Seth West Medical record number: [id number] Date of birth: 03/23/1941 Age: 73 y.o. Gender: male  I saw and evaluated the patient. I reviewed the resident's note and I agree with the resident's findings and plan as documented in the resident's note.  Mr. [name] is a 73 year old man with a history of hypertension who presents to the emergency department with complaints of a worsening right foot injury x3 weeks. 3 weeks prior to admission he bumped his foot on a dresser drawer with a minor wound. The wound did not heal, and, in fact, progressed to spread across his foot. Initially it was painful and he noticed decreased sensation over time with concomitant change in color. He denies a history of diabetes, peripheral vascular occlusive disease, or hyperlipidemia. He does have a history of smoking and quit 2 years ago.  On exam he has a denuded area that encompasses most of the dorsum of his right foot. On his plantar surface there is an area of hyperpigmentation with palpable fluctuance. His third, fourth, and fifth toes are gangrenous. There are no pulses palpable in the posterior tibialis area. I did not palpate the dorsalis pedis pulse as its complete territory is denuded.   Mr. [name] has gangrene of the right foot likely secondary to peripheral vascular occlusive disease given the results of the ABI in the emergency department. He will undergo a below the knee amputation of the right leg tomorrow by vascular surgery. We will continue current supportive care both preoperatively and postoperatively.

## 2013-07-07 ENCOUNTER — Encounter (HOSPITAL_COMMUNITY)
Admission: EM | Disposition: A | Discharge status: Skilled Nursing Facility | Payer: Self-pay | Source: Home / Self Care | Attending: Internal Medicine

## 2013-07-07 ENCOUNTER — Inpatient Hospital Stay (HOSPITAL_COMMUNITY): Payer: Medicare Other | Admitting: Anesthesiology

## 2013-07-07 DIAGNOSIS — L98499 Non-pressure chronic ulcer of skin of other sites with unspecified severity: Secondary | ICD-10-CM

## 2013-07-07 DIAGNOSIS — L97509 Non-pressure chronic ulcer of other part of unspecified foot with unspecified severity: Secondary | ICD-10-CM | POA: Diagnosis not present

## 2013-07-07 DIAGNOSIS — I739 Peripheral vascular disease, unspecified: Secondary | ICD-10-CM | POA: Diagnosis not present

## 2013-07-07 HISTORY — PX: AMPUTATION: SHX166

## 2013-07-07 LAB — CBC
HCT: 34.9 % — ABNORMAL LOW (ref 39.0–52.0)
Hemoglobin: 12.3 g/dL — ABNORMAL LOW (ref 13.0–17.0)
MCH: 31.6 pg (ref 26.0–34.0)
MCHC: 35.2 g/dL (ref 30.0–36.0)
MCV: 89.7 fL (ref 78.0–100.0)
PLATELETS: 333 10*3/uL (ref 150–400)
RBC: 3.89 MIL/uL — AB (ref 4.22–5.81)
RDW: 13 % (ref 11.5–15.5)
WBC: 14.9 10*3/uL — ABNORMAL HIGH (ref 4.0–10.5)

## 2013-07-07 LAB — BASIC METABOLIC PANEL
BUN: 19 mg/dL (ref 6–23)
CALCIUM: 9.6 mg/dL (ref 8.4–10.5)
CO2: 26 mEq/L (ref 19–32)
Chloride: 97 mEq/L (ref 96–112)
Creatinine, Ser: 1.13 mg/dL (ref 0.50–1.35)
GFR calc Af Amer: 72 mL/min — ABNORMAL LOW (ref >90)
GFR calc non Af Amer: 62 mL/min — ABNORMAL LOW (ref >90)
GLUCOSE: 104 mg/dL — AB (ref 70–99)
Potassium: 5 mEq/L (ref 3.7–5.3)
SODIUM: 134 meq/L — AB (ref 137–147)

## 2013-07-07 LAB — URINALYSIS, ROUTINE W REFLEX MICROSCOPIC
BILIRUBIN URINE: NEGATIVE
GLUCOSE, UA: NEGATIVE mg/dL
Hgb urine dipstick: NEGATIVE
KETONES UR: NEGATIVE mg/dL
Leukocytes, UA: NEGATIVE
NITRITE: NEGATIVE
Protein, ur: NEGATIVE mg/dL
Specific Gravity, Urine: 1.016 (ref 1.005–1.030)
Urobilinogen, UA: 4 mg/dL — ABNORMAL HIGH (ref 0.0–1.0)
pH: 6 (ref 5.0–8.0)

## 2013-07-07 LAB — RAPID URINE DRUG SCREEN, HOSP PERFORMED
Amphetamines: NOT DETECTED
BARBITURATES: NOT DETECTED
BENZODIAZEPINES: NOT DETECTED
Cocaine: NOT DETECTED
Opiates: NOT DETECTED
TETRAHYDROCANNABINOL: NOT DETECTED

## 2013-07-07 LAB — PROTIME-INR
INR: 1.13 (ref 0.00–1.49)
Prothrombin Time: 14.3 seconds (ref 11.6–15.2)

## 2013-07-07 SURGERY — AMPUTATION BELOW KNEE
Anesthesia: General | Site: Leg Lower | Laterality: Right

## 2013-07-07 MED ORDER — FENTANYL CITRATE 0.05 MG/ML IJ SOLN
INTRAMUSCULAR | Status: DC | PRN
Start: 2013-07-07 — End: 2013-07-07
  Administered 2013-07-07: 100 ug via INTRAVENOUS
  Administered 2013-07-07 (×2): 50 ug via INTRAVENOUS

## 2013-07-07 MED ORDER — PROPOFOL 10 MG/ML IV BOLUS
INTRAVENOUS | Status: AC
  Filled 2013-07-07: qty 20

## 2013-07-07 MED ORDER — VANCOMYCIN HCL IN DEXTROSE 1-5 GM/200ML-% IV SOLN
1000.0000 mg | Freq: Once | INTRAVENOUS | Status: AC
  Administered 2013-07-07: 1000 mg via INTRAVENOUS
  Filled 2013-07-07: qty 200

## 2013-07-07 MED ORDER — LACTATED RINGERS IV SOLN
INTRAVENOUS | Status: DC | PRN
  Administered 2013-07-07: 11:00:00 via INTRAVENOUS

## 2013-07-07 MED ORDER — OXYCODONE HCL 5 MG/5ML PO SOLN: 5.0000 mg | Freq: Once | ORAL | Status: DC | PRN

## 2013-07-07 MED ORDER — PHENYLEPHRINE 40 MCG/ML (10ML) SYRINGE FOR IV PUSH (FOR BLOOD PRESSURE SUPPORT)
PREFILLED_SYRINGE | INTRAVENOUS | Status: AC
  Filled 2013-07-07: qty 10

## 2013-07-07 MED ORDER — LIDOCAINE HCL (CARDIAC) 20 MG/ML IV SOLN: INTRAVENOUS | Status: DC | PRN

## 2013-07-07 MED ORDER — HYDROMORPHONE HCL PF 1 MG/ML IJ SOLN
0.2500 mg | INTRAMUSCULAR | Status: DC | PRN
Start: 2013-07-07 — End: 2013-07-07

## 2013-07-07 MED ORDER — PHENYLEPHRINE HCL 10 MG/ML IJ SOLN
INTRAMUSCULAR | Status: DC | PRN
  Administered 2013-07-07 (×2): 80 ug via INTRAVENOUS

## 2013-07-07 MED ORDER — ONDANSETRON HCL 4 MG/2ML IJ SOLN
INTRAMUSCULAR | Status: AC
  Filled 2013-07-07: qty 2

## 2013-07-07 MED ORDER — LIDOCAINE HCL (CARDIAC) 20 MG/ML IV SOLN
INTRAVENOUS | Status: DC | PRN
  Administered 2013-07-07: 80 mg via INTRAVENOUS

## 2013-07-07 MED ORDER — FENTANYL CITRATE 0.05 MG/ML IJ SOLN
INTRAMUSCULAR | Status: AC
  Filled 2013-07-07: qty 5

## 2013-07-07 MED ORDER — ONDANSETRON HCL 4 MG/2ML IJ SOLN
INTRAMUSCULAR | Status: DC | PRN
  Administered 2013-07-07: 4 mg via INTRAVENOUS

## 2013-07-07 MED ORDER — OXYCODONE HCL 5 MG PO TABS: 5.0000 mg | ORAL_TABLET | Freq: Once | ORAL | Status: DC | PRN

## 2013-07-07 MED ORDER — PROPOFOL 10 MG/ML IV BOLUS
INTRAVENOUS | Status: DC | PRN
  Administered 2013-07-07: 110 mg via INTRAVENOUS

## 2013-07-07 MED ORDER — PROMETHAZINE HCL 25 MG/ML IJ SOLN: 6.2500 mg | INTRAMUSCULAR | Status: DC | PRN

## 2013-07-07 MED ORDER — 0.9 % SODIUM CHLORIDE (POUR BTL) OPTIME
TOPICAL | Status: DC | PRN
  Administered 2013-07-07: 1000 mL

## 2013-07-07 MED ORDER — MORPHINE SULFATE 2 MG/ML IJ SOLN: 1.0000 mg | INTRAMUSCULAR | Status: DC | PRN

## 2013-07-07 MED ORDER — LIDOCAINE HCL (CARDIAC) 20 MG/ML IV SOLN
INTRAVENOUS | Status: AC
  Filled 2013-07-07: qty 5

## 2013-07-07 SURGICAL SUPPLY — 49 items
BANDAGE ELASTIC 6 VELCRO ST LF (GAUZE/BANDAGES/DRESSINGS) ×3 IMPLANT
BANDAGE ESMARK 6X9 LF (GAUZE/BANDAGES/DRESSINGS) IMPLANT
BANDAGE GAUZE ELAST BULKY 4 IN (GAUZE/BANDAGES/DRESSINGS) ×6 IMPLANT
BLADE SAW RECIP 87.9 MT (BLADE) ×3 IMPLANT
BNDG CMPR 9X6 STRL LF SNTH (GAUZE/BANDAGES/DRESSINGS)
BNDG COHESIVE 6X5 TAN STRL LF (GAUZE/BANDAGES/DRESSINGS) ×3 IMPLANT
BNDG ESMARK 6X9 LF (GAUZE/BANDAGES/DRESSINGS)
BNDG GAUZE ELAST 4 BULKY (GAUZE/BANDAGES/DRESSINGS) ×2 IMPLANT
CANISTER SUCTION 2500CC (MISCELLANEOUS) ×3 IMPLANT
CLIP TI MEDIUM 6 (CLIP) IMPLANT
COVER SURGICAL LIGHT HANDLE (MISCELLANEOUS) ×3 IMPLANT
CUFF TOURNIQUET SINGLE 24IN (TOURNIQUET CUFF) IMPLANT
CUFF TOURNIQUET SINGLE 34IN LL (TOURNIQUET CUFF) IMPLANT
CUFF TOURNIQUET SINGLE 44IN (TOURNIQUET CUFF) IMPLANT
DRAPE ORTHO SPLIT 77X108 STRL (DRAPES) ×6
DRAPE PROXIMA HALF (DRAPES) IMPLANT
DRAPE SURG ORHT 6 SPLT 77X108 (DRAPES) ×2 IMPLANT
DRSG ADAPTIC 3X8 NADH LF (GAUZE/BANDAGES/DRESSINGS) ×3 IMPLANT
DRSG PAD ABDOMINAL 8X10 ST (GAUZE/BANDAGES/DRESSINGS) ×1 IMPLANT
ELECT REM PT RETURN 9FT ADLT (ELECTROSURGICAL) ×3
ELECTRODE REM PT RTRN 9FT ADLT (ELECTROSURGICAL) ×1 IMPLANT
EVACUATOR 1/8 PVC DRAIN (DRAIN) ×3 IMPLANT
GLOVE ECLIPSE 7.5 STRL STRAW (GLOVE) ×2 IMPLANT
GLOVE SS BIOGEL STRL SZ 7 (GLOVE) ×1 IMPLANT
GLOVE SUPERSENSE BIOGEL SZ 7 (GLOVE) ×2
GOWN STRL REUS W/ TWL LRG LVL3 (GOWN DISPOSABLE) ×3 IMPLANT
GOWN STRL REUS W/TWL LRG LVL3 (GOWN DISPOSABLE) ×9
KIT BASIN OR (CUSTOM PROCEDURE TRAY) ×3 IMPLANT
KIT ROOM TURNOVER OR (KITS) ×3 IMPLANT
NS IRRIG 1000ML POUR BTL (IV SOLUTION) ×3 IMPLANT
PACK GENERAL/GYN (CUSTOM PROCEDURE TRAY) ×3 IMPLANT
PAD ARMBOARD 7.5X6 YLW CONV (MISCELLANEOUS) ×6 IMPLANT
PADDING CAST COTTON 6X4 STRL (CAST SUPPLIES) IMPLANT
SPONGE GAUZE 4X4 12PLY (GAUZE/BANDAGES/DRESSINGS) ×6 IMPLANT
SPONGE GAUZE 4X4 12PLY STER LF (GAUZE/BANDAGES/DRESSINGS) ×2 IMPLANT
STAPLER VISISTAT 35W (STAPLE) ×5 IMPLANT
STOCKINETTE IMPERVIOUS LG (DRAPES) ×3 IMPLANT
SUT SILK 2 0 (SUTURE) ×3
SUT SILK 2 0 SH CR/8 (SUTURE) ×6 IMPLANT
SUT SILK 2-0 18XBRD TIE 12 (SUTURE) ×1 IMPLANT
SUT SILK 3 0 (SUTURE) ×3
SUT SILK 3-0 18XBRD TIE 12 (SUTURE) ×1 IMPLANT
SUT VIC AB 0 CTX 18 (SUTURE) ×9 IMPLANT
SUT VIC AB 2-0 CT1 18 (SUTURE) ×3 IMPLANT
SUT VIC AB 2-0 CTX 36 (SUTURE) ×6 IMPLANT
TOWEL OR 17X24 6PK STRL BLUE (TOWEL DISPOSABLE) ×3 IMPLANT
TOWEL OR 17X26 10 PK STRL BLUE (TOWEL DISPOSABLE) ×3 IMPLANT
UNDERPAD 30X30 INCONTINENT (UNDERPADS AND DIAPERS) ×3 IMPLANT
WATER STERILE IRR 1000ML POUR (IV SOLUTION) ×3 IMPLANT

## 2013-07-07 NOTE — H&P (View-Only) (Signed)
[facility] [place]   MRN : [id number]  Reason for Consult: Right foot ulcer with Chronic ishemia Referring Physician: ED  History of Present Illness: 73 y/o male has reported having a foot wound that has progressed over the past 2 weeks and his daughter called EMS to bring him to the hospital today.  He has been washing the foot with alcohol.  He denise DM, stroke, or hyperlipidemia.  He does have hypertension according to his daughter, but does not take any medications.  He does not go to the doctor on a regular basis. He has a history of smoking.     Current Facility-Administered Medications  Medication Dose Route Frequency Provider Last Rate Last Dose  . piperacillin-tazobactam (ZOSYN) IVPB 3.375 g  3.375 g Intravenous Once Gilda Creasehristopher J. Pollina, MD       No current outpatient prescriptions on file.    Pt meds include: Statin :No Betablocker: No ASA: No Other anticoagulants/antiplatelets:   Past Medical History  Diagnosis Date  . Hypertension     has been off meds for years.     History reviewed. No pertinent past surgical history. Dental procedures  Social History History  Substance Use Topics  . Smoking status: Not on file  . Smokeless tobacco: Not on file  . Alcohol Use: Not on file    Family History Cousin - ? cancer  Daughter - Hodgkins Lymphoma  No Known Allergies   REVIEW OF SYSTEMS  General: [ ]  Weight loss, [ ]  Fever, [ ]  chills Neurologic: [ ]  Dizziness, [ ]  Blackouts, [ ]  Seizure [ ]  Stroke, [ ]  "Mini stroke", [ ]  Slurred speech, [ ]  Temporary blindness; [ ]  weakness in arms or legs, [ ]  Hoarseness [ ]  Dysphagia Cardiac: [ ]  Chest pain/pressure, [ ]  Shortness of breath at rest [ ]  Shortness of breath with exertion, [ ]  Atrial fibrillation or irregular heartbeat  Vascular: [ ]  Pain in legs with walking, [x ] Pain in legs at rest, [ ]  Pain in legs at night,  [ ]  Non-healing ulcer, [ ]  Blood clot in vein/DVT,    Pulmonary: [ ]  Home oxygen, [ ]  Productive cough, [ ]  Coughing up blood, [ ]  Asthma,  [ ]  Wheezing [ ]  COPD Musculoskeletal:  [ ]  Arthritis, [ ]  Low back pain, [ ]  Joint pain Hematologic: [ ]  Easy Bruising, [ ]  Anemia; [ ]  Hepatitis Gastrointestinal: [ ]  Blood in stool, [ ]  Gastroesophageal Reflux/heartburn, Urinary: [ ]  chronic Kidney disease, [ ]  on HD - [ ]  MWF or [ ]  TTHS, [ ]  Burning with urination, [ ]  Difficulty urinating Skin: [ ]  Rashes, [x ] Wounds  Psychological: [ ]  Anxiety, [ ]  Depression  Physical Examination Filed Vitals:   07/05/13 1219 07/05/13 1500  BP: 160/78 157/82  Pulse: 114 109  Temp: 98.4 F (36.9 C)   TempSrc: Oral   Resp: 18 18  SpO2: 97% 99%   There is no height or weight on file to calculate BMI.  General:  NAD HENT: WNL Eyes: Pupils equal, cataracts Pulmonary: normal non-labored breathing , without Rales, rhonchi,  wheezing Cardiac: RRR, without  Murmurs, rubs or gallops; No carotid bruits Abdomen: soft, NT, no masses Skin: no rashes, ulcers noted;  positve Gangrene , positive cellulitis; positive open wounds;   Right foot and leg are erythematous with edema, plantar foot blister, black necrotic toes skin feels warm to touch.  Vascular Exam/Pulses:Femoral pulses palpable, no popliteal.  Doppler faint  AT signal bil. Radial pulses palpable bil.   Musculoskeletal: positive muscle wasting left foot and ankle with atrophy.  Neurologic: A&O X 3; Appropriate Affect ;  SENSATION: decreased bil. LE Right > left MOTOR FUNCTION: limited exam Bil. LE, Upper ext. 5/5 grossly equal Speech is fluent/normal   Significant Diagnostic Studies: CBC Lab Results  Component Value Date   WBC 15.9* 07/05/2013   HGB 14.6 07/05/2013   HCT 39.5 07/05/2013   MCV 89.6 07/05/2013   PLT 378 07/05/2013    BMET    Component Value Date/Time   NA 136* 07/05/2013 1328   K 4.3 07/05/2013 1328   CL 96 07/05/2013 1328   CO2 22 07/05/2013 1328   GLUCOSE 94 07/05/2013 1328    BUN 23 07/05/2013 1328   CREATININE 1.08 07/05/2013 1328   CALCIUM 10.3 07/05/2013 1328   GFRNONAA 66* 07/05/2013 1328   GFRAA 76* 07/05/2013 1328   CrCl is unknown because there is no height on file for the current visit.  COAG No results found for this basename: INR, PROTIME     Non-Invasive Vascular Imaging:  Pending ABI, venous duplex  ASSESSMENT/PLAN:  Infected/chronic ischemic right foot It is likely he with need a right BKA verses AKA.   Clinton [facility] 07/05/2013 3:25 PM  Agree with above assessment Patient states this has been present for 2 weeks but probably much longer He has 3+ popliteal pulse palpable in right lower extremity with nonsalvageable right foot  Needs right below knee amputation-discussed with patient and his daughter Patient on vancomycin and Zosyn Plan surgery for Friday morning

## 2013-07-07 NOTE — Interval H&P Note (Signed)
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 After consideration of risks, benefits and other options for treatment, the patient has consented to  Procedure(s): AMPUTATION BELOW KNEE- RIGHT (Right) as a surgical intervention .  The patient's history has been reviewed, patient examined, no change in status, stable for surgery.  I have reviewed the patient's chart and labs.  Questions were answered to the patient's satisfaction.     Josephina GipLAWSON, JAMES

## 2013-07-07 NOTE — Progress Notes (Signed)
Subjective:  Patient seen at bedside this AM. No complaints except for a dull ache in his right leg. Denies fever, chills, nausea, vomiting.  Objective:  Vital signs in last 24 hours: Filed Vitals:   07/06/13 1336 07/06/13 1804 07/06/13 2129 07/07/13 0558  BP: 130/81 140/96 161/79 146/85  Pulse: 85 101 96 85  Temp: 98.7 F (37.1 C) 99.6 F (37.6 C) 100.3 F (37.9 C) 99.4 F (37.4 C)  TempSrc: Oral Oral Axillary Oral  Resp: $Remo'20 21 18 16  'zQHFW$ Weight:      SpO2: 96% 96% 93% 94%   Weight change:   Intake/Output Summary (Last 24 hours) at 07/07/13 0820 Last data filed at 07/06/13 1900  Gross per 24 hour  Intake 873.75 ml  Output      0 ml  Net 873.75 ml   Physical Exam: General: Alert, cooperative, and in no apparent distress HEENT: Vision grossly intact, oropharynx clear and non-erythematous  Neck: Full range of motion without pain, supple, no lymphadenopathy or carotid bruits Lungs: Clear to ascultation bilaterally, normal work of respiration, no wheezes, rales, ronchi Heart: Regular rate and rhythm, no murmurs, gallops, or rubs Abdomen: Soft, non-tender, non-distended, normal bowel sounds Extremities: RLE w/ foul odor, large wound on the dorsum of the foot (10 cm x 5 cm), w/ surrounding necrotic skin. Entire foot cool to the touch, no distal pulse. Popliteal pulse present. 3rd-5th toes black in color. +2 pitting edema in the RLE. LLE w/out ulcers or lesions. Distal pulses intact. No edema.  Neurologic: Alert & oriented X3, cranial nerves II-XII intact, Sensory losses in the RLE, mostly involving the foot.    Lab Results: Basic Metabolic Panel:  Recent Labs Lab 07/06/13 0502 07/07/13 0619  NA 135* 134*  K 4.3 5.0  CL 98 97  CO2 20 26  GLUCOSE 97 104*  BUN 19 19  CREATININE 0.95 1.13  CALCIUM 9.5 9.6   Liver Function Tests:  Recent Labs Lab 07/05/13 1328 07/06/13 0502  AST 80* 50*  ALT 97* 67*  ALKPHOS 232* 196*  BILITOT 1.0 1.1  PROT 9.6* 7.9  ALBUMIN  3.0* 2.3*   CBC:  Recent Labs Lab 07/05/13 1328 07/06/13 0502 07/07/13 0619  WBC 15.9* 14.2* 14.9*  NEUTROABS 12.8*  --   --   HGB 14.6 12.6* 12.3*  HCT 39.5 34.7* 34.9*  MCV 89.6 89.0 89.7  PLT 378 378 333   Cardiac Enzymes:  Recent Labs Lab 07/05/13 1328  CKTOTAL 281*   Hemoglobin A1C:  Recent Labs Lab 07/06/13 0502  HGBA1C 5.4    Fasting Lipid Panel:  Recent Labs Lab 07/06/13 0502  CHOL 107  HDL 45  LDLCALC 51  TRIG 53  CHOLHDL 2.4   Coagulation:  Recent Labs Lab 07/07/13 0619  LABPROT 14.3  INR 1.13    Urine Drug Screen: Drugs of Abuse  No results found for this basename: labopia,  cocainscrnur,  labbenz,  amphetmu,  thcu,  labbarb    Alcohol Level:  Recent Labs Lab 07/05/13 2027  ETH <11   Urinalysis: No results found for this basename: COLORURINE, APPERANCEUR, LABSPEC, [place], GLUCOSEU, HGBUR, BILIRUBINUR, KETONESUR, PROTEINUR, UROBILINOGEN, NITRITE, LEUKOCYTESUR,  in the last 168 hours  Micro Results: Recent Results (from the past 240 hour(s))  CULTURE, BLOOD (ROUTINE X 2)     Status: None   Collection Time    07/05/13  1:20 PM      Result Value Ref Range Status   Specimen Description BLOOD HAND RIGHT  Final   Special Requests BOTTLES DRAWN AEROBIC ONLY 10CC   Final   Culture  Setup Time     Final   Value: 07/05/2013 21:02     Performed at [facility]   Culture     Final   Value:        BLOOD CULTURE RECEIVED NO GROWTH TO DATE CULTURE WILL BE HELD FOR 5 DAYS BEFORE ISSUING A FINAL NEGATIVE REPORT     Performed at [facility]   Report Status PENDING   Incomplete  CULTURE, BLOOD (ROUTINE X 2)     Status: None   Collection Time    07/05/13  1:30 PM      Result Value Ref Range Status   Specimen Description BLOOD RIGHT FOREARM   Final   Special Requests     Final   Value: BOTTLES DRAWN AEROBIC AND ANAEROBIC 10CCBLUE  5CCRED   Culture  Setup Time     Final   Value: 07/05/2013 21:03     Performed at [facility]   Culture     Final   Value:        BLOOD CULTURE RECEIVED NO GROWTH TO DATE CULTURE WILL BE HELD FOR 5 DAYS BEFORE ISSUING A FINAL NEGATIVE REPORT     Performed at [facility]   Report Status PENDING   Incomplete    Studies/Results: Dg Foot Complete Right  07/05/2013   CLINICAL DATA:  Wound infection.  EXAM: RIGHT FOOT COMPLETE - 3+ VIEW  COMPARISON:  None.  FINDINGS: There is no fracture or dislocation or bone destruction. Minimal degenerative changes are present at the first metatarsal phalangeal joint. Osseous structures are otherwise normal. Small vessel calcification is noted in the foot. Minimal osteophyte formation at the anterior aspect of the distal tibia.  IMPRESSION: No acute abnormality. Specifically, no findings suggestive of osteomyelitis.   Electronically Signed   By: Rozetta Nunnery M.D.   On: 07/05/2013 14:02   Medications: I have reviewed the patient's current medications. Scheduled Meds: . amLODipine  5 mg Oral Daily  . antiseptic oral rinse  15 mL Mouth Rinse BID  . docusate sodium  100 mg Oral BID  . heparin  5,000 Units Subcutaneous 3 times per day  . metoprolol tartrate  25 mg Oral BID  . piperacillin-tazobactam (ZOSYN)  IV  3.375 g Intravenous Q8H  . vancomycin  1,250 mg Intravenous Q24H   Continuous Infusions:  PRN Meds:.oxyCODONE  Assessment/Plan: [name] is a 73 y.o. male w/ PMHx of HTN, presents to the ED w/ complaints of worsening right foot wound, admitted for RLE gangrene.   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 Leukocytosis 14.9, mildly increased from 14.2 yesterday. -BKA today -Continue Vancomycin + Zosyn  -Trend CBC  -Blood cultures negative to date -Oxycodone 5 mg q4h prn. Can add morphine prn s/p BKA  Peripheral Vascular Disease- Patient w/ h/o HTN, but denies DM, CAD, or previous CVA. Admits to a h/o smoking but claims that he quit 2 years ago after his friend dies from lung cancer. Lipid profile completely  wnl. -HbA1c 5.4 -VVS on board as above   Transaminitis- Improving. Patient w/ no previous h/o liver disease, denies a recent h/o alcohol abuse. In ED, found to have AST of 80, ALT of 97, Alk phos of 232. Repeat CMP yesterday showed improvement in LFT's. EtOh negative, acute hepatitis panel and HIV negative.  -Continue to follow  HTN- Patient w/ h/o HTN, not on  any medications at home. Blood pressure improved today, 140's/90's. -Continue Metoprolol 25 mg bid + Norvasc 5 mg po qd -Continue to monitor    DVT/PE PPx- Heparin [place]  Dispo: Disposition is deferred at this time, awaiting improvement of current medical problems.  Anticipated discharge in approximately 3-4 day(s).   The patient does not have a current PCP (Pcp Not In System) and does need an [facility] hospital follow-up appointment after discharge.  The patient does not have transportation limitations that hinder transportation to clinic appointments.  Services Needed at time of discharge: Y = Yes, Blank = No PT:   OT:   RN:   Equipment:   Other:     LOS: 2 days   Corky Sox, MD 07/07/2013, 8:20 AM

## 2013-07-07 NOTE — Op Note (Signed)
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 endotracheal  Complications: None  Patient to the operating room placed in supine position at which time satisfactory general endotracheal anesthesia was minister. Right lower extremity was prepped with Betadine scrub and solution draped in routine sterile manner isolating the foot and lower third of the leg with an impervious stockinette. Skin flaps for a below knee of dictation were marked basing the tibia about 4 inches distal to the knee joint with posterior flap twice length the anterior flap. Skin incision was carried down through skin subcutaneous tissue with scalpel. Muscle is divided with the cautery. The tibia and fibula were cleaned proximally with periosteal elevator divided with a Stryker saw beveling the tibia anteriorly and smoothly with a rasp. Vessels were individually ligated with 2-0 silk ties and ligatures as was the nerve allowed to retract proximally. Posterior muscle divided with Bovie specimen removed from the table. Adequate hemostasis was achieved Hemovac drain brought out medially and laterally secured with silk sutures. Fascia was closed with interrupted 2-0 Vicryl subcutaneous tissue with continuous 2-0 Vicryl skin with staples sterile dressing applied patient taken to recovery in stable condition   Procedure Details:   Seth GipJames Krishauna Schatzman, MD 07/07/2013 12:42 PM

## 2013-07-07 NOTE — Progress Notes (Signed)
  Date: 07/07/2013  Patient name: Seth West  Medical record number: [id number]  Date of birth: [birth year]   This patient has been seen and the plan of care was discussed with the house staff. Please see their note for complete details. I concur with their findings with the following additions/corrections: Mr [name] was lying in bed. He has no complaints. Pain fairly well; controlled. For BKA R foot today. Follow creatinine post OR.   Burns SpainElizabeth A Paymon Rosensteel, MD 07/07/2013, 11:26 AM

## 2013-07-07 NOTE — Transfer of Care (Signed)
Immediate Anesthesia Transfer of Care Note  Patient: Seth West  Procedure(s) Performed: Procedure(s): AMPUTATION BELOW KNEE- RIGHT (Right)  Patient Location: PACU  Anesthesia Type:General  Level of Consciousness: awake, sedated, patient cooperative and responds to stimulation  Airway & Oxygen Therapy: Patient Spontanous Breathing and Patient connected to nasal cannula oxygen  Post-op Assessment: Report given to PACU RN, Post -op Vital signs reviewed and stable and Post -op Vital signs reviewed and unstable, Anesthesiologist notified  Post vital signs: Reviewed and stable  Complications: No apparent anesthesia complications

## 2013-07-07 NOTE — Interval H&P Note (Signed)
History and Physical Interval Note:  07/07/2013 10:26 AM  Seth CuminsNathaniel West  has presented today for surgery, with the diagnosis of Infection of Right Foot Chronic Ischemia Right Foot  The various methods of treatment have been discussed with the patient and family. After consideration of risks, benefits and other options for treatment, the patient has consented to  Procedure(s): AMPUTATION BELOW KNEE- RIGHT (Right) as a surgical intervention .  The patient's history has been reviewed, patient examined, no change in status, stable for surgery.  I have reviewed the patient's chart and labs.  Questions were answered to the patient's satisfaction.     Josephina GipLAWSON, JAMES

## 2013-07-07 NOTE — Anesthesia Preprocedure Evaluation (Signed)
Anesthesia Evaluation    Reviewed: Allergy & Precautions, H&P , NPO status , Patient's Chart, lab work & pertinent test results  History of Anesthesia Complications Negative for: history of anesthetic complications  Airway Mallampati: II TM Distance: >3 FB Neck ROM: Full    Dental  (+) Edentulous Upper, Missing, Dental Advisory Given   Pulmonary former smoker,    Pulmonary exam normal       Cardiovascular hypertension, + Peripheral Vascular Disease     Neuro/Psych negative neurological ROS  negative psych ROS   GI/Hepatic negative GI ROS, Neg liver ROS,   Endo/Other  negative endocrine ROS  Renal/GU negative Renal ROS     Musculoskeletal   Abdominal   Peds  Hematology negative hematology ROS (+)   Anesthesia Other Findings   Reproductive/Obstetrics                           Anesthesia Physical Anesthesia Plan  ASA: III  Anesthesia Plan: General   Post-op Pain Management:    Induction: Intravenous  Airway Management Planned: LMA  Additional Equipment:   Intra-op Plan:   Post-operative Plan: Extubation in OR  Informed Consent: I have reviewed the patients History and Physical, chart, labs and discussed the procedure including the risks, benefits and alternatives for the proposed anesthesia with the patient or authorized representative who has indicated his/her understanding and acceptance.   Dental advisory given  Plan Discussed with: CRNA, Anesthesiologist and Surgeon  Anesthesia Plan Comments:         Anesthesia Quick Evaluation

## 2013-07-07 NOTE — Anesthesia Postprocedure Evaluation (Signed)
Anesthesia Post Note  Patient: Seth West  Procedure(s) Performed: Procedure(s) (LRB): AMPUTATION BELOW KNEE- RIGHT (Right)  Anesthesia type: general  Patient location: PACU  Post pain: Pain level controlled  Post assessment: Patient's Cardiovascular Status Stable  Last Vitals:  Filed Vitals:   07/07/13 1452  BP: 130/83  Pulse: 80  Temp: 36.9 C  Resp: 18    Post vital signs: Reviewed and stable  Level of consciousness: sedated  Complications: No apparent anesthesia complications

## 2013-07-07 NOTE — Anesthesia Procedure Notes (Addendum)
Performed by: Coralee RudFLORES, Mela Perham   Procedure Name: LMA Insertion Date/Time: 07/07/2013 11:21 AM Performed by: Coralee RudFLORES, Coraleigh Sheeran Pre-anesthesia Checklist: Patient identified, Emergency Drugs available, Suction available and Patient being monitored Patient Re-evaluated:Patient Re-evaluated prior to inductionOxygen Delivery Method: Circle system utilized Preoxygenation: Pre-oxygenation with 100% oxygen Intubation Type: IV induction Ventilation: Mask ventilation without difficulty LMA: LMA inserted LMA Size: 5.0 Number of attempts: 1 Placement Confirmation: positive ETCO2

## 2013-07-08 LAB — VANCOMYCIN, TROUGH: Vancomycin Tr: 21.1 ug/mL — ABNORMAL HIGH (ref 10.0–20.0)

## 2013-07-08 LAB — BASIC METABOLIC PANEL
BUN: 33 mg/dL — AB (ref 6–23)
CO2: 23 mEq/L (ref 19–32)
Calcium: 9.5 mg/dL (ref 8.4–10.5)
Chloride: 100 mEq/L (ref 96–112)
Creatinine, Ser: 2.58 mg/dL — ABNORMAL HIGH (ref 0.50–1.35)
GFR calc Af Amer: 27 mL/min — ABNORMAL LOW (ref >90)
GFR, EST NON AFRICAN AMERICAN: 23 mL/min — AB (ref >90)
Glucose, Bld: 105 mg/dL — ABNORMAL HIGH (ref 70–99)
POTASSIUM: 5 meq/L (ref 3.7–5.3)
Sodium: 137 mEq/L (ref 137–147)

## 2013-07-08 LAB — CBC
HCT: 33.1 % — ABNORMAL LOW (ref 39.0–52.0)
Hemoglobin: 11.8 g/dL — ABNORMAL LOW (ref 13.0–17.0)
MCH: 32 pg (ref 26.0–34.0)
MCHC: 35.6 g/dL (ref 30.0–36.0)
MCV: 89.7 fL (ref 78.0–100.0)
Platelets: 354 K/uL (ref 150–400)
RBC: 3.69 MIL/uL — ABNORMAL LOW (ref 4.22–5.81)
RDW: 13.1 % (ref 11.5–15.5)
WBC: 14.1 K/uL — ABNORMAL HIGH (ref 4.0–10.5)

## 2013-07-08 MED ORDER — AMLODIPINE BESYLATE 10 MG PO TABS
10.0000 mg | ORAL_TABLET | Freq: Every day | ORAL | Status: DC
  Administered 2013-07-09: 10 mg via ORAL
  Filled 2013-07-08: qty 1

## 2013-07-08 NOTE — Progress Notes (Signed)
Patient ID: Verda Cuminsathaniel Goyal, male   DOB: May 23, 1941, 73 y.o.   MRN: [id number] Vascular Surgery Progress Note  Subjective: One-day post right BKA  Objective:  Filed Vitals:   07/08/13 0841  BP: 152/78  Pulse: 84  Temp: 99.2 F (37.3 C)  Resp: 17    Gen. patient alert and oriented Right BKA dressing is dry with Hemovac drains in place-minimal drainage   Labs:  Recent Labs Lab 07/06/13 0502 07/07/13 0619 07/08/13 0407  CREATININE 0.95 1.13 2.58*    Recent Labs Lab 07/06/13 0502 07/07/13 0619 07/08/13 0407  NA 135* 134* 137  K 4.3 5.0 5.0  CL 98 97 100  CO2 20 26 23   BUN 19 19 33*  CREATININE 0.95 1.13 2.58*  GLUCOSE 97 104* 105*  CALCIUM 9.5 9.6 9.5    Recent Labs Lab 07/06/13 0502 07/07/13 0619 07/08/13 0407  WBC 14.2* 14.9* 14.1*  HGB 12.6* 12.3* 11.8*  HCT 34.7* 34.9* 33.1*  PLT 378 333 354    Recent Labs Lab 07/07/13 0619  INR 1.13    I/O last 3 completed shifts: In: 700 [I.V.:700] Out: -   Imaging: No results found.  Assessment/Plan:  POD #1   LOS: 3 days  s/p Procedure(s): AMPUTATION BELOW KNEE- RIGHT  Plan removal of dressing in a.m. and removal of Hemovac drains to check wound   Josephina GipJames Kyre Jeffries, MD 07/08/2013 9:08 AM

## 2013-07-08 NOTE — Progress Notes (Addendum)
Subjective:  Pt is POD#1 s/p R BKA. He endorses some pain in his right leg at the surgical site. He denies CP, SOB, or N/V.  Objective:  Vital signs in last 24 hours: Filed Vitals:   07/07/13 1758 07/07/13 2122 07/08/13 0446 07/08/13 0841  BP: 147/81 177/94 152/82 152/78  Pulse: 94 85 88 84  Temp: 98.6 F (37 C) 100 F (37.8 C) 100.3 F (37.9 C) 99.2 F (37.3 C)  TempSrc: Oral Axillary Oral Oral  Resp: _0 Weight:      SpO2: 94% 94% 93% 97%   Weight change:   Intake/Output Summary (Last 24 hours) at 07/08/13 1131 Last data filed at 07/08/13 0900  Gross per 24 hour  Intake    820 ml  Output      0 ml  Net    820 ml   Physical Exam: General: Alert, cooperative, and in no apparent distress HEENT: EOMI Neck: Full range of motion without pain Lungs: Clear to ascultation bilaterally, normal work of respiration, no wheezes, rales, ronchi Heart: Regular rate and rhythm, no murmurs, gallops, or rubs Abdomen: Soft, non-tender, non-distended, normal bowel sounds Extremities: R BKA with drains in place with minimal sanguinous output. LLE w/out ulcers or lesions. No edema.  Neurologic: Alert & oriented X3, cranial nerves II-XII intact, no focal neurological deficits.    Lab Results: Basic Metabolic Panel:  Recent Labs Lab 07/07/13 0619 07/08/13 0407  NA 134* 137  K 5.0 5.0  CL 97 100  CO2 26 23  GLUCOSE 104* 105*  BUN 19 33*  CREATININE 1.13 2.58*  CALCIUM 9.6 9.5   Liver Function Tests:  Recent Labs Lab 07/05/13 1328 07/06/13 0502  AST 80* 50*  ALT 97* 67*  ALKPHOS 232* 196*  BILITOT 1.0 1.1  PROT 9.6* 7.9  ALBUMIN 3.0* 2.3*   CBC:  Recent Labs Lab 07/05/13 1328  07/07/13 0619 07/08/13 0407  WBC 15.9*  < > 14.9* 14.1*  NEUTROABS 12.8*  --   --   --   HGB 14.6  < > 12.3* 11.8*  HCT 39.5  < > 34.9* 33.1*  MCV 89.6  < > 89.7 89.7  PLT 378  < > 333 354  < > = values in this interval not displayed. Cardiac Enzymes:  Recent Labs Lab  07/05/13 1328  CKTOTAL 281*   Hemoglobin A1C:  Recent Labs Lab 07/06/13 0502  HGBA1C 5.4    Fasting Lipid Panel:  Recent Labs Lab 07/06/13 0502  CHOL 107  HDL 45  LDLCALC 51  TRIG 53  CHOLHDL 2.4   Coagulation:  Recent Labs Lab 07/07/13 0619  LABPROT 14.3  INR 1.13    Urine Drug Screen: Drugs of Abuse     Component Value Date/Time   LABOPIA NONE DETECTED 07/07/2013 1815    Alcohol Level:  Recent Labs Lab 07/05/13 2027  ETH <11   Urinalysis:  Recent Labs Lab 07/07/13 1815  COLORURINE AMBER*  LABSPEC 1.016  PHURINE 6.0  GLUCOSEU NEGATIVE  HGBUR NEGATIVE  BILIRUBINUR NEGATIVE  KETONESUR NEGATIVE  PROTEINUR NEGATIVE  UROBILINOGEN 4.0*  NITRITE NEGATIVE  LEUKOCYTESUR NEGATIVE    Micro Results: Recent Results (from the past 240 hour(s))  CULTURE, BLOOD (ROUTINE X 2)     Status: None   Collection Time    07/05/13  1:20 PM      Result Value Ref Range Status   Specimen Description BLOOD HAND RIGHT   Final   Special Requests BOTTLES  DRAWN AEROBIC ONLY 10CC   Final   Culture  Setup Time     Final   Value: 07/05/2013 21:02     Performed at [facility]   Culture     Final   Value:        BLOOD CULTURE RECEIVED NO GROWTH TO DATE CULTURE WILL BE HELD FOR 5 DAYS BEFORE ISSUING A FINAL NEGATIVE REPORT     Performed at [facility]   Report Status PENDING   Incomplete  CULTURE, BLOOD (ROUTINE X 2)     Status: None   Collection Time    07/05/13  1:30 PM      Result Value Ref Range Status   Specimen Description BLOOD RIGHT FOREARM   Final   Special Requests     Final   Value: BOTTLES DRAWN AEROBIC AND ANAEROBIC 10CCBLUE  5CCRED   Culture  Setup Time     Final   Value: 07/05/2013 21:03     Performed at [facility]   Culture     Final   Value:        BLOOD CULTURE RECEIVED NO GROWTH TO DATE CULTURE WILL BE HELD FOR 5 DAYS BEFORE ISSUING A FINAL NEGATIVE REPORT     Performed at [facility]   Report Status PENDING    Incomplete    Studies/Results: No results found. Medications: I have reviewed the patient's current medications. Scheduled Meds: . amLODipine  5 mg Oral Daily  . antiseptic oral rinse  15 mL Mouth Rinse BID  . docusate sodium  100 mg Oral BID  . heparin  5,000 Units Subcutaneous 3 times per day  . metoprolol tartrate  25 mg Oral BID  . piperacillin-tazobactam (ZOSYN)  IV  3.375 g Intravenous Q8H  . vancomycin  1,250 mg Intravenous Q24H   Continuous Infusions:  PRN Meds:.morphine injection, oxyCODONE  Assessment/Plan: [name] is a 73 y.o. male w/ PMHx of HTN, presents to the ED w/ complaints of worsening right foot wound, admitted for RLE gangrene and is s/p R BKA.   RLE Gangrene: Pt is POD #1 s/p R BKA. Leukocytosis with mild improvement to 14.1 from 14.9 yesterday. Will continuing abx. Blood cultures NGTD. Temp up to 1003 this morning, likely post op fever. Ordered IS for the pt.  - Continue Vancomycin + Zosyn  - Incentive spirometry - am CBC  - F/u Bcx - Oxycodone 5 mg q4h prn and morphine1-65m q2 PRN - Will consult PT  AKI: Pt with sig increase in Cr. Possible that it is from being post op w/ poor intraoperative renal perfusion in the setting of Vancomycin therapy, but will have to continue to monitor.   Peripheral Vascular Disease: Patient w/ h/o HTN, but denies DM, CAD, or previous CVA. Admits to a h/o smoking but claims that he quit 2 years ago after his friend dies from lung cancer. Lipid profile completely wnl. -HbA1c 5.4 -VVS on board as above   Transaminitis: Improved. Patient w/ no previous h/o liver disease, denies a recent h/o alcohol abuse. In ED, found to have AST of 80, ALT of 97, Alk phos of 232. Repeat CMP yesterday showed improvement in LFT's. EtOh negative, acute hepatitis panel and HIV negative.  - LFTs in am  HTN: Patient w/ h/o HTN, not on any medications at home. Blood pressure elevated today, possibly worsened 2/2 pain. Increasing  CCB -Continue Metoprolol 25 mg bid  -Increase Norvasc to 10 mg po qd -Continue to  monitor    DVT/PE PPx: Heparin [place]  Dispo: Disposition is deferred at this time, awaiting improvement of current medical problems.  Anticipated discharge in approximately 1-2 day(s).   The patient does not have a current PCP (Pcp Not In System) and does need an [facility] hospital follow-up appointment after discharge.  The patient does not have transportation limitations that hinder transportation to clinic appointments.  Services Needed at time of discharge: Y = Yes, Blank = No PT:   OT:   RN:   Equipment:   Other:     LOS: 3 days   Otho Bellows, MD 07/08/2013, 11:31 AM

## 2013-07-08 NOTE — Progress Notes (Addendum)
ANTIBIOTIC CONSULT NOTE - Follow Up  Pharmacy Consult for Vancomycin and Zosyn Indication: Cellulitis  No Known Allergies  Patient Measurements: Weight: 160 lb 0.9 oz (72.6 kg)Weight: 70kg (patient estimate)  Vital Signs: Temp: 98.6 F (37 C) (02/14 1300) Temp src: Oral (02/14 1300) BP: 144/76 mmHg (02/14 1300) Pulse Rate: 85 (02/14 1300) Intake/Output from previous day: 02/13 0701 - 02/14 0700 In: 700 [I.V.:700] Out: -  Intake/Output from this shift: Total I/O In: 180 [P.O.:180] Out: 200 [Urine:200]  Labs:  Recent Labs  07/06/13 0502 07/07/13 0619 07/08/13 0407  WBC 14.2* 14.9* 14.1*  HGB 12.6* 12.3* 11.8*  PLT 378 333 354  CREATININE 0.95 1.13 2.58*   CrCl is unknown because there is no height on file for the current visit.  Microbiology: Recent Results (from the past 720 hour(s))  CULTURE, BLOOD (ROUTINE X 2)     Status: None   Collection Time    07/05/13  1:20 PM      Result Value Ref Range Status   Specimen Description BLOOD HAND RIGHT   Final   Special Requests BOTTLES DRAWN AEROBIC ONLY 10CC   Final   Culture  Setup Time     Final   Value: 07/05/2013 21:02     Performed at [facility]   Culture     Final   Value:        BLOOD CULTURE RECEIVED NO GROWTH TO DATE CULTURE WILL BE HELD FOR 5 DAYS BEFORE ISSUING A FINAL NEGATIVE REPORT     Performed at [facility]   Report Status PENDING   Incomplete  CULTURE, BLOOD (ROUTINE X 2)     Status: None   Collection Time    07/05/13  1:30 PM      Result Value Ref Range Status   Specimen Description BLOOD RIGHT FOREARM   Final   Special Requests     Final   Value: BOTTLES DRAWN AEROBIC AND ANAEROBIC 10CCBLUE  5CCRED   Culture  Setup Time     Final   Value: 07/05/2013 21:03     Performed at [facility]   Culture     Final   Value:        BLOOD CULTURE RECEIVED NO GROWTH TO DATE CULTURE WILL BE HELD FOR 5 DAYS BEFORE ISSUING A FINAL NEGATIVE REPORT     Performed at [facility]   Report Status PENDING   Incomplete   Medical History: Past Medical History  Diagnosis Date  . Hypertension     has been off meds for years.   . Peripheral vascular disease   . Gangrene of foot 06/2013    right/notes 07/05/2013   Assessment: 72yom to start Vancomycin and Zosyn for RLE ulcer/cellulitis.  Found to have gangrene which didn't respond to medical treatment and is now s/p R BKA. Continues to have some mild leukocytosis but blood cultures have been negative thus far.  He remains afebrile.  His creatinine has doubled however, which may be the result of surgery but could indicate drug accumulation as well.   He received 2 doses yesterday of IV Vancomycin.  One was given pre-op and one was given post-op.    Goal of Therapy:  Vancomycin trough level 10-15 mcg/ml  Plan:  1. Will check a s/s level to ensure clearance and continue IV Vancomycin 1.25g IV q24h if he is clearing appropriately 2. Will continue Zosyn 3.375g IV q8h - infuse over 4 hours Nadara MustardNita Gaylen Venning, PharmD., MS  Clinical Pharmacist Pager:  [phone] Thank you for allowing pharmacy to be part of this patients care team. 07/08/2013,2:51 PM   Addendum: Vancomycin trough is 21.11mcg/ml which is above our goal for this patient.  I will hold dose tonight.  F/u AM BMET and re-dose once renal function has improved.  Nadara Mustard, PharmD., MS Clinical Pharmacist Pager:  [phone] Thank you for allowing pharmacy to be part of this patients care team.

## 2013-07-08 NOTE — Progress Notes (Signed)
Temp 100.3 F orally. Temperature seems to be trending upwards.  MD paged.  Pt reported no complaints.  Will continue to assess.

## 2013-07-08 NOTE — Progress Notes (Signed)
PT Cancellation Note  Patient Details Name: Seth West MRN: [id number] DOB: [birth year]   Cancelled Treatment:    Reason Eval/Treat Not Completed: Patient declined, no reason specified.  Pt states "there is no need to get up, I'm just going to lay back down anyway"  PT attempted to educate pt on benefits of OOB, pt continued to refuse.  Will attempt at later time as appropriate   Loy Little 07/08/2013, 1:56 PM

## 2013-07-09 DIAGNOSIS — N179 Acute kidney failure, unspecified: Secondary | ICD-10-CM | POA: Diagnosis not present

## 2013-07-09 LAB — CBC
HEMATOCRIT: 33.3 % — AB (ref 39.0–52.0)
Hemoglobin: 11.8 g/dL — ABNORMAL LOW (ref 13.0–17.0)
MCH: 31.9 pg (ref 26.0–34.0)
MCHC: 35.4 g/dL (ref 30.0–36.0)
MCV: 90 fL (ref 78.0–100.0)
Platelets: 319 10*3/uL (ref 150–400)
RBC: 3.7 MIL/uL — AB (ref 4.22–5.81)
RDW: 13 % (ref 11.5–15.5)
WBC: 10.5 10*3/uL (ref 4.0–10.5)

## 2013-07-09 LAB — COMPREHENSIVE METABOLIC PANEL
ALK PHOS: 171 U/L — AB (ref 39–117)
ALT: 42 U/L (ref 0–53)
AST: 54 U/L — ABNORMAL HIGH (ref 0–37)
Albumin: 2 g/dL — ABNORMAL LOW (ref 3.5–5.2)
BUN: 46 mg/dL — AB (ref 6–23)
CO2: 21 mEq/L (ref 19–32)
CREATININE: 4.09 mg/dL — AB (ref 0.50–1.35)
Calcium: 9.2 mg/dL (ref 8.4–10.5)
Chloride: 101 mEq/L (ref 96–112)
GFR calc non Af Amer: 13 mL/min — ABNORMAL LOW (ref >90)
GFR, EST AFRICAN AMERICAN: 15 mL/min — AB (ref >90)
GLUCOSE: 93 mg/dL (ref 70–99)
POTASSIUM: 4.9 meq/L (ref 3.7–5.3)
Sodium: 140 mEq/L (ref 137–147)
TOTAL PROTEIN: 7.5 g/dL (ref 6.0–8.3)
Total Bilirubin: 0.8 mg/dL (ref 0.3–1.2)

## 2013-07-09 LAB — URINALYSIS, ROUTINE W REFLEX MICROSCOPIC
Bilirubin Urine: NEGATIVE
Glucose, UA: NEGATIVE mg/dL
KETONES UR: NEGATIVE mg/dL
LEUKOCYTES UA: NEGATIVE
NITRITE: NEGATIVE
PROTEIN: NEGATIVE mg/dL
Specific Gravity, Urine: 1.018 (ref 1.005–1.030)
Urobilinogen, UA: 1 mg/dL (ref 0.0–1.0)
pH: 5 (ref 5.0–8.0)

## 2013-07-09 LAB — SODIUM, URINE, RANDOM: Sodium, Ur: 74 mEq/L

## 2013-07-09 LAB — CREATININE, URINE, RANDOM: CREATININE, URINE: 63.94 mg/dL

## 2013-07-09 LAB — URINE MICROSCOPIC-ADD ON

## 2013-07-09 LAB — CK: Total CK: 520 U/L — ABNORMAL HIGH (ref 7–232)

## 2013-07-09 MED ORDER — SODIUM CHLORIDE 0.9 % IV SOLN
INTRAVENOUS | Status: AC
  Administered 2013-07-09 – 2013-07-10 (×4): via INTRAVENOUS

## 2013-07-09 MED ORDER — SODIUM CHLORIDE 0.9 % IV SOLN
INTRAVENOUS | Status: DC
  Administered 2013-07-09: 11:00:00 via INTRAVENOUS

## 2013-07-09 MED ORDER — AMLODIPINE BESYLATE 5 MG PO TABS
5.0000 mg | ORAL_TABLET | Freq: Two times a day (BID) | ORAL | Status: DC
  Administered 2013-07-10 – 2013-07-12 (×5): 5 mg via ORAL
  Filled 2013-07-09 (×6): qty 1

## 2013-07-09 MED ORDER — PIPERACILLIN-TAZOBACTAM IN DEX 2-0.25 GM/50ML IV SOLN
2.2500 g | Freq: Three times a day (TID) | INTRAVENOUS | Status: DC
  Filled 2013-07-09 (×3): qty 50

## 2013-07-09 NOTE — Progress Notes (Signed)
Bladder scan done at this time,first reading was 35 ml and second reading was 72 ml,patient has condom catheter since admission and urine free flowing observed since then.Specimen send to lab.

## 2013-07-09 NOTE — Progress Notes (Signed)
ANTIBIOTIC CONSULT NOTE - Follow Up  Pharmacy Consult for Vancomycin and Zosyn Indication: Cellulitis  No Known Allergies  Patient Measurements: Weight: 156 lb 1.4 oz (70.8 kg)Weight: 70kg (patient estimate)  Vital Signs: Temp: 98.2 F (36.8 C) (02/15 1001) Temp src: Oral (02/15 1001) BP: 112/58 mmHg (02/15 1031) Pulse Rate: 68 (02/15 1031) Intake/Output from previous day: 02/14 0701 - 02/15 0700 In: 420 [P.O.:420] Out: 565 [Urine:500; Drains:65] Intake/Output from this shift: Total I/O In: 120 [P.O.:120] Out: 400 [Urine:400]  Labs:  Recent Labs  07/07/13 0619 07/08/13 0407 07/09/13 0620  WBC 14.9* 14.1* 10.5  HGB 12.3* 11.8* 11.8*  PLT 333 354 319  CREATININE 1.13 2.58* 4.09*   CrCl is unknown because there is no height on file for the current visit.  Microbiology: Recent Results (from the past 720 hour(s))  CULTURE, BLOOD (ROUTINE X 2)     Status: None   Collection Time    07/05/13  1:20 PM      Result Value Ref Range Status   Specimen Description BLOOD HAND RIGHT   Final   Special Requests BOTTLES DRAWN AEROBIC ONLY 10CC   Final   Culture  Setup Time     Final   Value: 07/05/2013 21:02     Performed at [facility]   Culture     Final   Value:        BLOOD CULTURE RECEIVED NO GROWTH TO DATE CULTURE WILL BE HELD FOR 5 DAYS BEFORE ISSUING A FINAL NEGATIVE REPORT     Performed at [facility]   Report Status PENDING   Incomplete  CULTURE, BLOOD (ROUTINE X 2)     Status: None   Collection Time    07/05/13  1:30 PM      Result Value Ref Range Status   Specimen Description BLOOD RIGHT FOREARM   Final   Special Requests     Final   Value: BOTTLES DRAWN AEROBIC AND ANAEROBIC 10CCBLUE  5CCRED   Culture  Setup Time     Final   Value: 07/05/2013 21:03     Performed at [facility]   Culture     Final   Value:        BLOOD CULTURE RECEIVED NO GROWTH TO DATE CULTURE WILL BE HELD FOR 5 DAYS BEFORE ISSUING A FINAL NEGATIVE REPORT   Performed at [facility]   Report Status PENDING   Incomplete   Medical History: Past Medical History  Diagnosis Date  . Hypertension     has been off meds for years.   . Peripheral vascular disease   . Gangrene of foot 06/2013    right/notes 07/05/2013   Assessment: 72yom to start Vancomycin and Zosyn for RLE ulcer/cellulitis.  Found to have gangrene which didn't respond to medical treatment and is now s/p R BKA. Leukocytosis has improved 14.1>>10.5K. He remains afebrile.  His creatinine doubled on 2/14 and continues up today at 4.09.  Considering this event - I do not think he has cleared his Vancomycin significantly and will plan to recheck a random level prior to another dose.   Blood cultures from 2/11 have NGTD.  Goal of Therapy:  Vancomycin trough level 10-15 mcg/ml  Plan:  1.  Will check a random level to ensure clearance prior to re-dose. 2.  Will decrease his Zosyn to 2.25 gm every 8 hours. 3.  Will monitor culture data and de-escalate as appropriate with MD.  Nadara MustardNita Dung Prien, PharmD., MS Clinical Pharmacist Pager:  [phone] Thank you for allowing pharmacy to be part of this patients care team. 07/09/2013,2:36 PM

## 2013-07-09 NOTE — Progress Notes (Signed)
Subjective:  Patient seen at bedside this AM, s/p Right BKA, post-op day #2. Says he is feeling well today, pain minimal this morning. Denies any fever, chills, nausea, vomiting. Wound vac + drains removed by vascular surgery, stump healing nicely. Leukocytosis resolved.   Cr increased to 4.09 from 2.58 yesterday.  Objective:  Vital signs in last 24 hours: Filed Vitals:   07/08/13 1300 07/08/13 1757 07/08/13 2140 07/09/13 0534  BP: 144/76 139/88 152/76 170/90  Pulse: 85 83 80 78  Temp: 98.6 F (37 C) 99.5 F (37.5 C) 97.6 F (36.4 C) 98.5 F (36.9 C)  TempSrc: Oral Oral Oral Oral  Resp: 19 20 20 18   Weight:   156 lb 1.4 oz (70.8 kg)   SpO2: 100% 94% 99% 96%   Weight change:   Intake/Output Summary (Last 24 hours) at 07/09/13 1610 Last data filed at 07/08/13 1850  Gross per 24 hour  Intake    420 ml  Output    565 ml  Net   -145 ml   Physical Exam: General: Alert, cooperative, and in no apparent distress HEENT: EOMI, PERRL Neck: Full range of motion without pain Lungs: Clear to ascultation bilaterally, normal work of respiration, no wheezes, rales, ronchi Heart: Regular rate and rhythm, no murmurs, gallops, or rubs Abdomen: Soft, non-tender, non-distended, normal bowel sounds Extremities: R BKA wrapped w/ ACE bandage, wound vac and drains removed. LLE w/out ulcers or lesions. No edema.  Neurologic: Alert & oriented X3, cranial nerves II-XII intact, no focal neurological deficits.    Lab Results: Basic Metabolic Panel:  Recent Labs Lab 07/07/13 0619 07/08/13 0407  NA 134* 137  K 5.0 5.0  CL 97 100  CO2 26 23  GLUCOSE 104* 105*  BUN 19 33*  CREATININE 1.13 2.58*  CALCIUM 9.6 9.5   Liver Function Tests:  Recent Labs Lab 07/05/13 1328 07/06/13 0502  AST 80* 50*  ALT 97* 67*  ALKPHOS 232* 196*  BILITOT 1.0 1.1  PROT 9.6* 7.9  ALBUMIN 3.0* 2.3*   CBC:  Recent Labs Lab 07/05/13 1328  07/08/13 0407 07/09/13 0620  WBC 15.9*  < > 14.1* 10.5    NEUTROABS 12.8*  --   --   --   HGB 14.6  < > 11.8* 11.8*  HCT 39.5  < > 33.1* 33.3*  MCV 89.6  < > 89.7 90.0  PLT 378  < > 354 319  < > = values in this interval not displayed. Cardiac Enzymes:  Recent Labs Lab 07/05/13 1328  CKTOTAL 281*   Hemoglobin A1C:  Recent Labs Lab 07/06/13 0502  HGBA1C 5.4    Fasting Lipid Panel:  Recent Labs Lab 07/06/13 0502  CHOL 107  HDL 45  LDLCALC 51  TRIG 53  CHOLHDL 2.4   Coagulation:  Recent Labs Lab 07/07/13 0619  LABPROT 14.3  INR 1.13    Urine Drug Screen: Drugs of Abuse     Component Value Date/Time   LABOPIA NONE DETECTED 07/07/2013 1815    Alcohol Level:  Recent Labs Lab 07/05/13 2027  ETH <11   Urinalysis:  Recent Labs Lab 07/07/13 1815  COLORURINE AMBER*  LABSPEC 1.016  PHURINE 6.0  GLUCOSEU NEGATIVE  HGBUR NEGATIVE  BILIRUBINUR NEGATIVE  KETONESUR NEGATIVE  PROTEINUR NEGATIVE  UROBILINOGEN 4.0*  NITRITE NEGATIVE  LEUKOCYTESUR NEGATIVE    Micro Results: Recent Results (from the past 240 hour(s))  CULTURE, BLOOD (ROUTINE X 2)     Status: None   Collection Time  07/05/13  1:20 PM      Result Value Ref Range Status   Specimen Description BLOOD HAND RIGHT   Final   Special Requests BOTTLES DRAWN AEROBIC ONLY 10CC   Final   Culture  Setup Time     Final   Value: 07/05/2013 21:02     Performed at [facility]   Culture     Final   Value:        BLOOD CULTURE RECEIVED NO GROWTH TO DATE CULTURE WILL BE HELD FOR 5 DAYS BEFORE ISSUING A FINAL NEGATIVE REPORT     Performed at [facility]   Report Status PENDING   Incomplete  CULTURE, BLOOD (ROUTINE X 2)     Status: None   Collection Time    07/05/13  1:30 PM      Result Value Ref Range Status   Specimen Description BLOOD RIGHT FOREARM   Final   Special Requests     Final   Value: BOTTLES DRAWN AEROBIC AND ANAEROBIC 10CCBLUE  5CCRED   Culture  Setup Time     Final   Value: 07/05/2013 21:03     Performed at [facility]   Culture     Final   Value:        BLOOD CULTURE RECEIVED NO GROWTH TO DATE CULTURE WILL BE HELD FOR 5 DAYS BEFORE ISSUING A FINAL NEGATIVE REPORT     Performed at [facility]   Report Status PENDING   Incomplete   Medications: I have reviewed the patient's current medications. Scheduled Meds: . amLODipine  10 mg Oral Daily  . antiseptic oral rinse  15 mL Mouth Rinse BID  . docusate sodium  100 mg Oral BID  . heparin  5,000 Units Subcutaneous 3 times per day  . metoprolol tartrate  25 mg Oral BID  . piperacillin-tazobactam (ZOSYN)  IV  3.375 g Intravenous Q8H   Continuous Infusions:  PRN Meds:.morphine injection, oxyCODONE  Assessment/Plan: Seth West is a 73 y.o. male w/ PMHx of HTN, presents to the ED w/ complaints of worsening right foot wound, admitted for RLE gangrene, now s/p R BKA.   RLE Gangrene: Pt is POD #3 s/p R BKA. Leukocytosis resolved. Blood cultures continue to be negative. Afebrile overnight. - Continue Zosyn; vancomycin d/c'ed d/t AKI (see below). - Incentive spirometry - Trend CBC  - F/u Bcx - Oxycodone 5 mg q4h prn and morphine1-2mg  q2 PRN - PT to follow; possible discharge to inpatient rehab tomorrow.  AKI- Patient w/ new elevation in Cr (baseline ~1.0), likely 2/2 Vancomycin + poor renal perfusion intraoperatively. FeNa calculated as 3.38%, therefore pointing towards and intrinsic etiology. Trend as follows:  Recent Labs Lab 07/05/13 1328 07/06/13 0502 07/07/13 0619 07/08/13 0407 07/09/13 0620  CREATININE 1.08 0.95 1.13 2.58* 4.09*  - Vancomycin d/c'ed at this time. Vancomycin trough 21.1 yesterday. - Will consult renal at this time given significant increase in creatinine. - Start NS @ 100 ml/hr for now.  - Monitor strict I/O's  Transaminitis: Improving. Acute Hepatitis panel negative, EtOh levels negative on admission. No h/o liver disease, no RUQ pain.  - Continue to follow.   HTN: Patient w/ h/o HTN, not on any  medications at home. Normotensive this AM. -Continue Metoprolol 25 mg bid  -Increase Norvasc to 10 mg po qd -Continue to monitor    DVT/PE PPx: Heparin [place]  Dispo: Disposition is deferred at this time, awaiting improvement of current medical problems.  Anticipated  discharge in approximately 1-2 day(s). Likely discharge to inpatient rehabilitation.   The patient does not have a current PCP (Pcp Not In System) and does need an [facility] hospital follow-up appointment after discharge.  The patient does not have transportation limitations that hinder transportation to clinic appointments.  Services Needed at time of discharge: Y = Yes, Blank = No PT:   OT:   RN:   Equipment:   Other:     LOS: 4 days   Courtney Paris, MD 07/09/2013, 8:03 AM

## 2013-07-09 NOTE — Progress Notes (Signed)
Patient ID: Seth West, male   DOB: [birth year], [age] y.o.   MRN: [id number] Vascular Surgery Progress Note  Subjective: 2 days post right BKA. States pain is much better. Has mild amount of phantom pain.  Objective:  Filed Vitals:   07/09/13 0534  BP: 170/90  Pulse: 78  Temp: 98.5 F (36.9 C)  Resp: 18    General alert and oriented x3 Right lower extremity examined. Dressing removed and BKA stump healing nicely. Hemovac drains removed.   Labs:  Recent Labs Lab 07/07/13 0619 07/08/13 0407 07/09/13 0620  CREATININE 1.13 2.58* 4.09*    Recent Labs Lab 07/07/13 0619 07/08/13 0407 07/09/13 0620  NA 134* 137 140  K 5.0 5.0 4.9  CL 97 100 101  CO2 26 23 21   BUN 19 33* 46*  CREATININE 1.13 2.58* 4.09*  GLUCOSE 104* 105* 93  CALCIUM 9.6 9.5 9.2    Recent Labs Lab 07/07/13 0619 07/08/13 0407 07/09/13 0620  WBC 14.9* 14.1* 10.5  HGB 12.3* 11.8* 11.8*  HCT 34.9* 33.1* 33.3*  PLT 333 354 319    Recent Labs Lab 07/07/13 0619  INR 1.13    I/O last 3 completed shifts: In: 420 [P.O.:420] Out: 565 [Urine:500; Drains:65]  Imaging: No results found.  Assessment/Plan:  POD #2   LOS: 4 days  s/p Procedure(s): AMPUTATION BELOW KNEE- RIGHT  Doing well 2 days post right below-knee amputation-stump healing well PT-OT consult and rehabilitation medicine consult pending   Seth GipJames Lawson, MD 07/09/2013 8:47 AM

## 2013-07-09 NOTE — Consult Note (Addendum)
Renal Service Consult Note [facility]  Seth West 07/09/2013 Delano Metz D Requesting Physician:  Dr Josem Kaufmann  Reason for Consult:  Acute renal failure HPI: The patient is a 73 y.o. year-old with limited medical history (HTN, no DM, no known PVD or heart problems) presented to ED on 2/11 for evaluation of R foot wound.  He had reportedly injured his foot a few weeks ago, bumped it on a piece of furniture.  The family then became concerned and called ambulance.  The patient was brought to the ED. He was a "vague" historian per ED notes.  He said foot wound with blister was there for 2 weeks, his family said it was more like 2 months.  The foot was white on top, 2nd and 3rd toes were black, and there was a large blister on the bottom of the foot. There was pitting edema extending to the knee on that side.  Family said he had HTN but had been off of medication for years.  He does not see any doctors on a regular basis. +smoking hx.    Vascular surgery saw patient had said that this had probably been there much longer than 2 weeks. He was noted to have a palpable popliteal pulse on that leg but the leg was considered to be nonsalvageable.  R BKA was recommended, pt was admitted and started on vanc and zosyn IV that evening. UA from 2/13 was negative.   Date  Creat  Meds  07/05/13 1.08  Vanc 1.25 gm, zosyn, IVF's, norvasc, MTP, sq hep 07/06/13 0.95  Vanc 1.25 gm, zosyn, IVF's, norvasc, MTP, sq hep 07/07/13 1.13  Vanc 2.25gm, zosyn, IVF's, norvasc, MTP, sq hep 07/08/13 2.58  Zosyn,  IVF's, norvasc, MTP, sq hep  07/09/13 4.09  Creat worsened starting yest, vanc was stopped yest. Creat up to 4 today. UOP down, bladder scanned and no urine in bladder. BP's have all been high or normal during the hospital course with the exception of one low BP after surgery 108/70) and low BP's this morning in the 80's.  Pt very vague historian, poor recollection of recent events. No cp, sob, n/v/d, no  abd pain, no hx kidney disease.  Past Medical History  Past Medical History  Diagnosis Date  . Hypertension     has been off meds for years.   . Peripheral vascular disease   . Gangrene of foot 06/2013    right/notes 07/05/2013   Past Surgical History  Past Surgical History  Procedure Laterality Date  . No past surgeries     Family History History reviewed. No pertinent family history. Social History  reports that he quit smoking about 8 weeks ago. His smoking use included Cigarettes. He has a 150 pack-year smoking history. He has never used smokeless tobacco. He reports that he drinks alcohol. He reports that he does not use illicit drugs. Allergies No Known Allergies Home medications Prior to Admission medications   Not on File   Liver Function Tests  Recent Labs Lab 07/05/13 1328 07/06/13 0502 07/09/13 0620  AST 80* 50* 54*  ALT 97* 67* 42  ALKPHOS 232* 196* 171*  BILITOT 1.0 1.1 0.8  PROT 9.6* 7.9 7.5  ALBUMIN 3.0* 2.3* 2.0*   No results found for this basename: LIPASE, AMYLASE,  in the last 168 hours CBC  Recent Labs Lab 07/05/13 1328  07/07/13 0619 07/08/13 0407 07/09/13 0620  WBC 15.9*  < > 14.9* 14.1* 10.5  NEUTROABS 12.8*  --   --   --   --  HGB 14.6  < > 12.3* 11.8* 11.8*  HCT 39.5  < > 34.9* 33.1* 33.3*  MCV 89.6  < > 89.7 89.7 90.0  PLT 378  < > 333 354 319  < > = values in this interval not displayed. Basic Metabolic Panel  Recent Labs Lab 07/05/13 1328 07/06/13 0502 07/07/13 0619 07/08/13 0407 07/09/13 0620  NA 136* 135* 134* 137 140  K 4.3 4.3 5.0 5.0 4.9  CL 96 98 97 100 101  CO2 22 20 26 23 21   GLUCOSE 94 97 104* 105* 93  BUN 23 19 19  33* 46*  CREATININE 1.08 0.95 1.13 2.58* 4.09*  CALCIUM 10.3 9.5 9.6 9.5 9.2    Exam  Blood pressure 112/58, pulse 68, temperature 98.2 F (36.8 C), temperature source Oral, resp. rate 19, weight 70.8 kg (156 lb 1.4 oz), SpO2 99.00%. Elderly slightly frail AAM in no distress No rash, cyanosis or  gangrene Sclera anicteric, throat clear JVP 12 cm Chest w slight insp rales at bases only RRR no MRG Abd soft, nontender, nd, no ascites or hsm GU condom cath, minimal urine in bag No LE or UE edema, R BKA dressed Neuro is nf, ox3, no asterixis  UA > neg prot, no rbc or wbc (2/13, 2d ago)  Assessment: 1 Acute oliguric renal failure - in setting of severe chronic leg wound requiring BKA on 2/13 and IV abx from the start of admission 4days ago including vanc and zosyn. No IV contrast or hypotension or other insults noted. Perhaps this is ATN from vanc toxicity, thought it seems a bit early and severe. Could have AIN from zosyn as well. No other good explanation and bladder scan is negative. Recommend stop all abx or at least change to a different class for now. I have stopped zosyn. Place foley cath, repeat UA, UNa pending, keep SBP over 120, cont gentle IVF"s for now at 75/hr.  Watch for signs of vol overload. Check CPK.  No indication for acute HD yet. Will follow.   2 R BKA on 2/13 for severe nonsalvageable leg wound  3 Hx HTN not on any home meds (does not see doctors)  Vinson Moselleob Anis Degidio MD (pgr) [phone]    [phone]) [phone] 07/09/2013, 2:20 PM

## 2013-07-09 NOTE — Evaluation (Signed)
Physical Therapy Evaluation Patient Details Name: Seth West MRN: [id number] DOB: 09/03/1940 Today's Date: 07/09/2013 Time: 1343-1400 PT Time Calculation (min): 17 min  PT Assessment / Plan / Recommendation History of Present Illness  Patient is a 73 yo male admitted with infection and chronic ischemia Rt foot.  Patient now s/p Rt BKA.  Clinical Impression  Patient presents with problems listed below.  Will benefit from acute PT to maximize independence prior to discharge.  Recommend Inpatient Rehab consult for comprehensive therapy program to address functional mobility and ADL training.    PT Assessment  Patient needs continued PT services    Follow Up Recommendations  CIR;Supervision/Assistance - 24 hour    Does the patient have the potential to tolerate intense rehabilitation      Barriers to Discharge        Equipment Recommendations  Wheelchair (measurements PT);Wheelchair cushion (measurements PT)    Recommendations for Other Services Rehab consult   Frequency Min 4X/week    Precautions / Restrictions Precautions Precautions: Fall Restrictions Weight Bearing Restrictions: No   Pertinent Vitals/Pain Pain 8/10 with any movement limiting mobility during session.      Mobility  Bed Mobility Overal bed mobility: Needs Assistance Bed Mobility: Rolling Rolling: Min assist General bed mobility comments: Verbal cues for technique.  Patient able to use rails for mobility.  "I don't want you pulling on me"  Allowed patient to do as much of the mobility as possible. Able to roll to both sides with min assist.  Attempted to sit EOB.  Patient with increased pain and refused to go further.  [facility] with bed in trendelenberg.    Exercises     PT Diagnosis: Generalized weakness;Acute pain  PT Problem List: Decreased strength;Decreased activity tolerance;Decreased mobility;Decreased balance;Decreased knowledge of use of DME;Pain PT  Treatment Interventions: DME instruction;Gait training;Functional mobility training;Therapeutic exercise;Balance training;Patient/family education     PT Goals(Current goals can be found in the care plan section) Acute Rehab PT Goals Patient Stated Goal: Decrease pain PT Goal Formulation: With patient Time For Goal Achievement: 07/16/13 Potential to Achieve Goals: Good  Visit Information  Last PT Received On: 07/09/13 Assistance Needed: +2 History of Present Illness: Patient is a 73 yo male admitted with infection and chronic ischemia Rt foot.  Patient now s/p Rt BKA.       Prior Functioning  Home Living Family/patient expects to be discharged to:: Inpatient rehab Living Arrangements: Spouse/significant other Available Help at Discharge: Family (Daughter (patient reports poss living with dtr at d/c)) Type of Home: House Home Access: Stairs to enter;Ramped entrance Home Layout: One level Home Equipment: Environmental consultantWalker - 2 wheels;Cane - single point Prior Function Level of Independence: Independent Comments: Per patient, he lived alone pta and was independent Communication Communication: No difficulties    Cognition  Cognition Arousal/Alertness: Lethargic Behavior During Therapy: Flat affect Overall Cognitive Status: No family/caregiver present to determine baseline cognitive functioning (Difficulty getting information from patient)    Extremity/Trunk Assessment Upper Extremity Assessment Upper Extremity Assessment: Overall WFL for tasks assessed Lower Extremity Assessment Lower Extremity Assessment: RLE deficits/detail RLE Deficits / Details: New BKA.  Able to lift RLE off of bed - at least 3/5.  Yells in pain with any movement. RLE: Unable to fully assess due to pain   Balance    End of Session PT - End of Session Activity Tolerance: Patient limited by pain;Patient limited by fatigue;Patient limited by lethargy Patient left: in bed;with call bell/phone  within reach Nurse  Communication: Patient requests pain meds  GP     Vena Austria 07/09/2013, 5:21 PM Durenda Hurt. Renaldo Fiddler, [facility] Acute Rehab Services Pager [phone]

## 2013-07-10 ENCOUNTER — Telehealth: Payer: Self-pay | Admitting: Vascular Surgery

## 2013-07-10 ENCOUNTER — Inpatient Hospital Stay (HOSPITAL_COMMUNITY): Payer: Medicare Other

## 2013-07-10 DIAGNOSIS — N179 Acute kidney failure, unspecified: Secondary | ICD-10-CM

## 2013-07-10 LAB — BASIC METABOLIC PANEL
BUN: 54 mg/dL — ABNORMAL HIGH (ref 6–23)
BUN: 53 mg/dL — ABNORMAL HIGH (ref 6–23)
CALCIUM: 9.3 mg/dL (ref 8.4–10.5)
CO2: 23 mEq/L (ref 19–32)
CO2: 20 mEq/L (ref 19–32)
CREATININE: 4.76 mg/dL — AB (ref 0.50–1.35)
CREATININE: 4.45 mg/dL — AB (ref 0.50–1.35)
Calcium: 9.4 mg/dL (ref 8.4–10.5)
Chloride: 104 mEq/L (ref 96–112)
Chloride: 105 mEq/L (ref 96–112)
GFR calc Af Amer: 13 mL/min — ABNORMAL LOW (ref >90)
GFR calc Af Amer: 14 mL/min — ABNORMAL LOW (ref >90)
GFR calc non Af Amer: 12 mL/min — ABNORMAL LOW (ref >90)
GFR, EST NON AFRICAN AMERICAN: 11 mL/min — AB (ref >90)
GLUCOSE: 96 mg/dL (ref 70–99)
Glucose, Bld: 98 mg/dL (ref 70–99)
Potassium: 5.2 mEq/L (ref 3.7–5.3)
Potassium: 5.2 mEq/L (ref 3.7–5.3)
SODIUM: 139 meq/L (ref 137–147)
Sodium: 139 mEq/L (ref 137–147)

## 2013-07-10 LAB — CBC
HEMATOCRIT: 31.8 % — AB (ref 39.0–52.0)
Hemoglobin: 11.2 g/dL — ABNORMAL LOW (ref 13.0–17.0)
MCH: 31.7 pg (ref 26.0–34.0)
MCHC: 35.2 g/dL (ref 30.0–36.0)
MCV: 90.1 fL (ref 78.0–100.0)
Platelets: 305 10*3/uL (ref 150–400)
RBC: 3.53 MIL/uL — ABNORMAL LOW (ref 4.22–5.81)
RDW: 13.3 % (ref 11.5–15.5)
WBC: 9.7 10*3/uL (ref 4.0–10.5)

## 2013-07-10 LAB — VANCOMYCIN, RANDOM: Vancomycin Rm: 17.6 ug/mL

## 2013-07-10 LAB — MAGNESIUM: MAGNESIUM: 2.5 mg/dL (ref 1.5–2.5)

## 2013-07-10 NOTE — Consult Note (Signed)
Physical Medicine and Rehabilitation Consult Reason for Consult: Right BKA Referring Physician: [facility]   HPI: Seth West is a 73 y.o. right-handed male with history of documented hypertension on no antihypertensive medications who was admitted 07/05/2013 with a right foot wound. Patient claims he was in his kitchen 2-3 weeks ago bumped his foot on a dresser drawer sustaining wound to the dorsum of his foot with progressive ischemic changes. X-rays of right foot showed no acute abnormalities no findings suggestive of osteomyelitis. Venous Doppler studies negative for DVT. Placed on broad-spectrum antibiotics. 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 Hart RochesterLawson. Postoperative pain control. Subcutaneous heparin for DVT prophylaxis. Hospital course acute renal failure with creatinine 2.58 elevated to 4.09 with renal services consulted 07/09/2013. Renal ultrasound is pending. Suspect ATN and placed on IV fluids. Physical therapy evaluation 07/09/2013 with recommendations for physical medicine rehabilitation consult   Review of Systems  Gastrointestinal: Positive for constipation.  Musculoskeletal:       Pain right foot  All other systems reviewed and are negative.   Past Medical History  Diagnosis Date  . Hypertension     has been off meds for years.   . Peripheral vascular disease   . Gangrene of foot 06/2013    right/notes 07/05/2013   Past Surgical History  Procedure Laterality Date  . No past surgeries     History reviewed. No pertinent family history. Social History:  reports that he quit smoking about 1 months ago. His smoking use included Cigarettes. He has a 150 pack-year smoking history. He has never used smokeless tobacco. He reports that he drinks alcohol. He reports that he does not use illicit drugs. Allergies: No Known Allergies No prescriptions prior to  admission    Home: Home Living Family/patient expects to be discharged to:: Inpatient rehab Living Arrangements: Spouse/significant other Available Help at Discharge: Family (Daughter (patient reports poss living with dtr at d/c)) Type of Home: House Home Access: Stairs to enter;Ramped entrance Home Layout: One level Home Equipment: Environmental consultantWalker - 2 wheels;Cane - single point  Functional History: Prior Function Comments: Per patient, he lived alone pta and was independent Functional Status:  Mobility:          ADL:    Cognition: Cognition Overall Cognitive Status: No family/caregiver present to determine baseline cognitive functioning (Difficulty getting information from patient) Orientation Level: Oriented to person;Oriented to place;Disoriented to time;Disoriented to situation Cognition Arousal/Alertness: Lethargic Behavior During Therapy: Flat affect Overall Cognitive Status: No family/caregiver present to determine baseline cognitive functioning (Difficulty getting information from patient)  Blood pressure 163/90, pulse 81, temperature 97.8 F (36.6 C), temperature source Oral, resp. rate 18, weight 68.6 kg (151 lb 3.8 oz), SpO2 100.00%. Physical Exam  Vitals reviewed. Constitutional: He is oriented to person, place, and time. He appears well-developed.  HENT:  Head: Normocephalic.  Eyes: EOM are normal.  Neck: Normal range of motion. Neck supple. No thyromegaly present.  Cardiovascular: Normal rate and regular rhythm.   Respiratory: Effort normal and breath sounds normal. No respiratory distress.  GI: Soft. Bowel sounds are normal. He exhibits no distension.  Neurological: He is alert and oriented to person, place, and time.  Skin:  BKA site is dressed and appropriately tender    Results for orders placed during the hospital encounter of 07/05/13 (from the past 24 hour(s))  COMPREHENSIVE METABOLIC PANEL     Status: Abnormal  Collection Time    07/09/13  6:20 AM        Result Value Ref Range   Sodium 140  137 - 147 mEq/L   Potassium 4.9  3.7 - 5.3 mEq/L   Chloride 101  96 - 112 mEq/L   CO2 21  19 - 32 mEq/L   Glucose, Bld 93  70 - 99 mg/dL   BUN 46 (*) 6 - 23 mg/dL   Creatinine, Ser 1.61 (*) 0.50 - 1.35 mg/dL   Calcium 9.2  8.4 - 09.6 mg/dL   Total Protein 7.5  6.0 - 8.3 g/dL   Albumin 2.0 (*) 3.5 - 5.2 g/dL   AST 54 (*) 0 - 37 U/L   ALT 42  0 - 53 U/L   Alkaline Phosphatase 171 (*) 39 - 117 U/L   Total Bilirubin 0.8  0.3 - 1.2 mg/dL   GFR calc non Af Amer 13 (*) >90 mL/min   GFR calc Af Amer 15 (*) >90 mL/min  CBC     Status: Abnormal   Collection Time    07/09/13  6:20 AM      Result Value Ref Range   WBC 10.5  4.0 - 10.5 K/uL   RBC 3.70 (*) 4.22 - 5.81 MIL/uL   Hemoglobin 11.8 (*) 13.0 - 17.0 g/dL   HCT 04.5 (*) 40.9 - 81.1 %   MCV 90.0  78.0 - 100.0 fL   MCH 31.9  26.0 - 34.0 pg   MCHC 35.4  30.0 - 36.0 g/dL   RDW 91.4  78.2 - 95.6 %   Platelets 319  150 - 400 K/uL  SODIUM, URINE, RANDOM     Status: None   Collection Time    07/09/13  1:03 PM      Result Value Ref Range   Sodium, Ur 74    CREATININE, URINE, RANDOM     Status: None   Collection Time    07/09/13  1:03 PM      Result Value Ref Range   Creatinine, Urine 63.94    CK     Status: Abnormal   Collection Time    07/09/13  4:15 PM      Result Value Ref Range   Total CK 520 (*) 7 - 232 U/L  URINALYSIS, ROUTINE W REFLEX MICROSCOPIC     Status: Abnormal   Collection Time    07/09/13  4:27 PM      Result Value Ref Range   Color, Urine YELLOW  YELLOW   APPearance CLOUDY (*) CLEAR   Specific Gravity, Urine 1.018  1.005 - 1.030   pH 5.0  5.0 - 8.0   Glucose, UA NEGATIVE  NEGATIVE mg/dL   Hgb urine dipstick MODERATE (*) NEGATIVE   Bilirubin Urine NEGATIVE  NEGATIVE   Ketones, ur NEGATIVE  NEGATIVE mg/dL   Protein, ur NEGATIVE  NEGATIVE mg/dL   Urobilinogen, UA 1.0  0.0 - 1.0 mg/dL   Nitrite NEGATIVE  NEGATIVE   Leukocytes, UA NEGATIVE  NEGATIVE  URINE  MICROSCOPIC-ADD ON     Status: None   Collection Time    07/09/13  4:27 PM      Result Value Ref Range   RBC / HPF 3-6  <3 RBC/hpf   Urine-Other AMORPHOUS URATES/PHOSPHATES     No results found.  Assessment/Plan: Diagnosis: right BKA 1. Does the need for close, 24 hr/day medical supervision in concert with the patient's rehab needs make it unreasonable for this patient to be  served in a less intensive setting? Yes 2. Co-Morbidities requiring supervision/potential complications: htn, PVD 3. Due to bladder management, bowel management, safety, skin/wound care, disease management, medication administration, pain management and patient education, does the patient require 24 hr/day rehab nursing? Yes 4. Does the patient require coordinated care of a physician, rehab nurse, PT (1-2 hrs/day, 5 days/week) and OT (1-2 hrs/day, 5 days/week) to address physical and functional deficits in the context of the above medical diagnosis(es)? Yes Addressing deficits in the following areas: balance, endurance, locomotion, strength, transferring, bowel/bladder control, bathing, dressing, feeding, grooming and toileting 5. Can the patient actively participate in an intensive therapy program of at least 3 hrs of therapy per day at least 5 days per week? Yes 6. The potential for patient to make measurable gains while on inpatient rehab is excellent 7. Anticipated functional outcomes upon discharge from inpatient rehab are mod I with PT, mod I with OT, n/a with SLP. 8. Estimated rehab length of stay to reach the above functional goals is: 7 days 9. Does the patient have adequate social supports to accommodate these discharge functional goals? Yes 10. Anticipated D/C setting: Home 11. Anticipated post D/C treatments: HH therapy 12. Overall Rehab/Functional Prognosis: excellent  RECOMMENDATIONS: This patient's condition is appropriate for continued rehabilitative care in the following setting: CIR Patient has agreed  to participate in recommended program. Yes Note that insurance prior authorization may be required for reimbursement for recommended care.  Comment: Rehab Admissions Coordinator to follow up.  Thanks,  Ranelle Oyster, MD, [place]     07/10/2013

## 2013-07-10 NOTE — Progress Notes (Signed)
Physical Therapy Treatment Patient Details Name: Seth West MRN: [id number] DOB: 10-23-1940 Today's Date: 07/10/2013 Time: [id number] PT Time Calculation (min): 34 min  PT Assessment / Plan / Recommendation  History of Present Illness Patient is a 73 yo male admitted with infection and chronic ischemia Rt foot.  Patient now s/p Rt BKA.   PT Comments   Patient able to tolerate standing briefly, but seemed initially hesitant needing encouragement and education on side effects of prolonged bedrest to participate.  He also seemed to respond more slowly as weakening through session.  Discussed with RN who reports pt hasn't been eating well.  Sat up in bed after tx and initiated eating meal.  Pt with decreased initiation and follow through.  Follow Up Recommendations  CIR     Does the patient have the potential to tolerate intense rehabilitation   yes  Barriers to Discharge  Decreased caregiver support      Equipment Recommendations  Wheelchair (measurements PT);Wheelchair cushion (measurements PT)    Recommendations for Other Services  None  Frequency Min 4X/week   Progress towards PT Goals Progress towards PT goals: Progressing toward goals  Plan Current plan remains appropriate    Precautions / Restrictions Precautions Precautions: Fall   Pertinent Vitals/Pain C/o min pain right BKA with movement    Mobility  Bed Mobility Overal bed mobility: Needs Assistance Bed Mobility: Supine to Sit Supine to sit: Min assist General bed mobility comments: assist to lift trunk, pt pulled on rail and turned his hips Transfers Overall transfer level: Needs assistance Equipment used: Rolling walker (2 wheeled) Transfers: Sit to/from Stand Sit to Stand: +2 physical assistance;From elevated surface;Total assist General transfer comment: 3 attempts at standing using walker and STEDY.  Patient unable to maintain upright standing more than few seconds due to left knee buckles, pt leaning  posterior and, in Stedy, unable to get hips far enough forward to lower flaps    Exercises General Exercises - Lower Extremity Quad Sets: PROM;Right;Supine;AAROM (work to get knee extended)     PT Goals (current goals can now be found in the care plan section)    Visit Information  Last PT Received On: 07/10/13 Assistance Needed: +2 History of Present Illness: Patient is a 73 yo male admitted with infection and chronic ischemia Rt foot.  Patient now s/p Rt BKA.    Subjective Data      Cognition  Cognition Arousal/Alertness: Awake/alert Behavior During Therapy: Flat affect Overall Cognitive Status: No family/caregiver present to determine baseline cognitive functioning    Balance  Balance Overall balance assessment: Needs assistance Sitting-balance support: Feet supported Sitting balance-Leahy Scale: Fair Sitting balance - Comments: left foot supported, held rail some, noted loss of balance posterior when lifting leg up Postural control: Posterior lean Standing balance support: Bilateral upper extremity supported Standing balance-Leahy Scale: Zero Standing balance comment: pt leans back and demonstrates weakness and fear of falling  End of Session PT - End of Session Equipment Utilized During Treatment: Gait belt Activity Tolerance: Patient limited by fatigue Patient left: in bed;with call bell/phone within reach;with bed alarm set Nurse Communication: Mobility status   GP     [facility] Center[facility]YNN,CYNDI 07/10/2013, 3:48 PM Sheran Lawlessyndi Mckenna Boruff, PT [phone] 07/10/2013

## 2013-07-10 NOTE — Progress Notes (Signed)
Rehab Admissions Coordinator Note:  Patient was screened by Clois DupesBoyette, Masen Salvas Godwin for appropriateness for an Inpatient Acute Rehab Consult.  At this time, inpt rehab consult is pending today and then an Admissions Coordinator will follow up.  Clois DupesBoyette, Magdalina Whitehead Godwin 07/10/2013, 9:28 AM  I can be reached at [phone].

## 2013-07-10 NOTE — Progress Notes (Signed)
Subjective:  Patient seen at bedside this AM, no new complaints. Says he is feeling okay today. No fever, chills, nausea, and vomiting. Says his appetite is okay. Did not sleep very well d/t constant disruptions.   Objective:  Vital signs in last 24 hours: Filed Vitals:   07/09/13 2046 07/10/13 0429 07/10/13 0941 07/10/13 1307  BP: 169/74 163/90 156/80 152/74  Pulse: 79 81 85 78  Temp: 98.5 F (36.9 C) 97.8 F (36.6 C) 98.6 F (37 C) 98.6 F (37 C)  TempSrc: Oral Oral Oral Oral  Resp: 18 18 18 19   Weight: 151 lb 3.8 oz (68.6 kg)     SpO2: 100% 100% 98% 100%   Weight change: -4 lb 13.6 oz (-2.2 kg)  Intake/Output Summary (Last 24 hours) at 07/10/13 1515 Last data filed at 07/10/13 0631  Gross per 24 hour  Intake    240 ml  Output   1800 ml  Net  -1560 ml   Physical Exam: General: Alert, cooperative, and in no apparent distress. Slightly flat affect. HEENT: EOMI, PERRL Neck: Full range of motion without pain Lungs: Clear to ascultation bilaterally, normal work of respiration, no wheezes, rales, ronchi Heart: Regular rate and rhythm, no murmurs, gallops, or rubs Abdomen: Soft, non-tender, non-distended, normal bowel sounds Extremities: R BKA wrapped w/ ACE bandage, only mild drainage seen on bandage. LLE w/out ulcers or lesions. No edema.  Neurologic: Alert & oriented X3, cranial nerves II-XII intact, no focal neurological deficits.    Lab Results: Basic Metabolic Panel:  Recent Labs Lab 07/09/13 0620 07/10/13 0713  NA 140 139  K 4.9 5.2  CL 101 104  CO2 21 23  GLUCOSE 93 98  BUN 46* 54*  CREATININE 4.09* 4.76*  CALCIUM 9.2 9.4  MG  --  2.5   Liver Function Tests:  Recent Labs Lab 07/06/13 0502 07/09/13 0620  AST 50* 54*  ALT 67* 42  ALKPHOS 196* 171*  BILITOT 1.1 0.8  PROT 7.9 7.5  ALBUMIN 2.3* 2.0*   CBC:  Recent Labs Lab 07/05/13 1328  07/09/13 0620 07/10/13 0713  WBC 15.9*  < > 10.5 9.7  NEUTROABS 12.8*  --   --   --   HGB 14.6  < >  11.8* 11.2*  HCT 39.5  < > 33.3* 31.8*  MCV 89.6  < > 90.0 90.1  PLT 378  < > 319 305  < > = values in this interval not displayed. Cardiac Enzymes:  Recent Labs Lab 07/05/13 1328 07/09/13 1615  CKTOTAL 281* 520*   Hemoglobin A1C:  Recent Labs Lab 07/06/13 0502  HGBA1C 5.4    Fasting Lipid Panel:  Recent Labs Lab 07/06/13 0502  CHOL 107  HDL 45  LDLCALC 51  TRIG 53  CHOLHDL 2.4   Coagulation:  Recent Labs Lab 07/07/13 0619  LABPROT 14.3  INR 1.13   Urine Drug Screen: Drugs of Abuse     Component Value Date/Time   LABOPIA NONE DETECTED 07/07/2013 1815    Alcohol Level:  Recent Labs Lab 07/05/13 2027  ETH <11   Urinalysis:  Recent Labs Lab 07/07/13 1815 07/09/13 1627  COLORURINE AMBER* YELLOW  LABSPEC 1.016 1.018  PHURINE 6.0 5.0  GLUCOSEU NEGATIVE NEGATIVE  HGBUR NEGATIVE MODERATE*  BILIRUBINUR NEGATIVE NEGATIVE  KETONESUR NEGATIVE NEGATIVE  PROTEINUR NEGATIVE NEGATIVE  UROBILINOGEN 4.0* 1.0  NITRITE NEGATIVE NEGATIVE  LEUKOCYTESUR NEGATIVE NEGATIVE   Micro Results: Recent Results (from the past 240 hour(s))  CULTURE, BLOOD (ROUTINE X 2)  Status: None   Collection Time    07/05/13  1:20 PM      Result Value Ref Range Status   Specimen Description BLOOD HAND RIGHT   Final   Special Requests BOTTLES DRAWN AEROBIC ONLY 10CC   Final   Culture  Setup Time     Final   Value: 07/05/2013 21:02     Performed at [facility]   Culture     Final   Value:        BLOOD CULTURE RECEIVED NO GROWTH TO DATE CULTURE WILL BE HELD FOR 5 DAYS BEFORE ISSUING A FINAL NEGATIVE REPORT     Performed at [facility]   Report Status PENDING   Incomplete  CULTURE, BLOOD (ROUTINE X 2)     Status: None   Collection Time    07/05/13  1:30 PM      Result Value Ref Range Status   Specimen Description BLOOD RIGHT FOREARM   Final   Special Requests     Final   Value: BOTTLES DRAWN AEROBIC AND ANAEROBIC 10CCBLUE  5CCRED   Culture  Setup Time      Final   Value: 07/05/2013 21:03     Performed at [facility]   Culture     Final   Value:        BLOOD CULTURE RECEIVED NO GROWTH TO DATE CULTURE WILL BE HELD FOR 5 DAYS BEFORE ISSUING A FINAL NEGATIVE REPORT     Performed at [facility]   Report Status PENDING   Incomplete   Medications: I have reviewed the patient's current medications. Scheduled Meds: . amLODipine  5 mg Oral BID  . antiseptic oral rinse  15 mL Mouth Rinse BID  . docusate sodium  100 mg Oral BID  . heparin  5,000 Units Subcutaneous 3 times per day  . metoprolol tartrate  25 mg Oral BID   Continuous Infusions: . sodium chloride 75 mL/hr at 07/10/13 0649   PRN Meds:.morphine injection, oxyCODONE  Assessment/Plan: Mr. Donnald Tabar is a 73 y.o. male w/ PMHx of HTN, presents to the ED w/ complaints of worsening right foot wound, admitted for RLE gangrene, now s/p R BKA.   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 Leukocytosis resolved. Blood cultures continue to be negative. Afebrile overnight. No issues at this time, only mild drainage on bandage this AM. -Antibiotics d/c'ed yesterday d/t concern for renal failure. -Continue incentive spirometry -Trend CBC  -F/u Bcx -Oxycodone 5 mg q4h prn and morphine 1-2mg  q2 PRN -Patient to be d/c'ed to CIR as soon as medically stable.   AKI- Patient w/ elevation in Cr (baseline ~1.0), likely 2/2 ABx vs ischemic ATN. FeNa calculated as 3.38%. Cr increase appears to be slowing, urine output improved overnight. Renal US shows medical renal disease, w/ no hydronephrosis. UA w/ moderate Hb, 3-6 RBC's, otherwise wnl. CK 520. Trend as follows:  Recent Labs Lab 07/06/13 0502 07/07/13 0619 07/08/13 0407 07/09/13 0620 07/10/13 0713  CREATININE 0.95 1.13 2.58* 4.09* 4.76*  -ABx d/ce'd yesterday. No further concern for infection.  -Renal following, feel that AKI is 2/2 ischemic ATN. Appears to be improving at this time. -Continue NS @ 75 ml/hr for ~10 more  hours -Repeat BMET in PM given increase in K. Treat w/ kayexalate as necessary. -Monitor strict I/O's.  HTN: Patient w/ h/o HTN, not on any medications at home. 150's/70's most recently. -Continue Metoprolol 25 mg bid  -Continue Norvasc to 10 mg  po qd -Continue to monitor    Transaminitis- Likely 2/2 acute illness on admission. Acute hepatitis panel negative, EtOh negative on admission.  -No further workup necessary at this time.  DVT/PE PPx: Heparin [place]  Dispo: Disposition is deferred at this time, awaiting improvement of current medical problems.  Anticipated discharge in approximately 1-2 day(s). Likely discharge to inpatient rehabilitation.   The patient does not have a current PCP (Pcp Not In System) and does need an [facility] hospital follow-up appointment after discharge.  The patient does not have transportation limitations that hinder transportation to clinic appointments.  Services Needed at time of discharge: Y = Yes, Blank = No PT:   OT:   RN:   Equipment:   Other:     LOS: 5 days   Courtney Paris, MD 07/10/2013, 3:15 PM Pager: [phone]

## 2013-07-10 NOTE — Progress Notes (Signed)
IP Rehab  Spoke with the patient at the bedside about his post acute rehab options when he is medically ready.  [facility] indicates [facility], however question his level of understanding of rehab.  Will attempt to contact his daughter Nonie HoyerMargie Puccinelli.  Note that pt. Still with renal issues and awaiting improvements.  Call if questions.    Weldon PickingSusan Reene Harlacher PT Inpatient Rehab Admissions Coordinator Cell [phone] Office [phone]

## 2013-07-10 NOTE — Telephone Encounter (Addendum)
Message copied by Fredrich BirksMILLIKAN, DANA P on Mon Jul 10, 2013  3:14 PM ------      Message from: Phillips OdorPULLINS, CAROL S      Created: Fri Jul 07, 2013  1:25 PM                   ----- Message -----         From: Dara LordsSamantha J Rhyne, PA-C         Sent: 07/07/2013  12:45 PM           To: [facility] Charge Pool            S/p right BKA 07/07/13.  F/u with Dr. Hart RochesterLawson in 4 weeks.            Thanks,      Lelon MastSamantha ------  07/10/13: lm with pts daughter, we will touch base once he has been discharged. She isnt sure what the plan will be for him at that time.

## 2013-07-10 NOTE — Progress Notes (Signed)
Patient ID: Seth West, male   DOB: 1941/02/09, 73 y.o.   MRN: [id number] S:no complaints O:BP 156/80  Pulse 85  Temp(Src) 98.6 F (37 C) (Oral)  Resp 18  Wt 68.6 kg (151 lb 3.8 oz)  SpO2 98%  Intake/Output Summary (Last 24 hours) at 07/10/13 1222 Last data filed at 07/10/13 0631  Gross per 24 hour  Intake    360 ml  Output   1920 ml  Net  -1560 ml   Intake/Output: I/O last 3 completed shifts: In: 480 [P.O.:480] Out: 2320 [Urine:2320]  Intake/Output this shift:    Weight change: -2.2 kg (-4 lb 13.6 oz) Gen:WD, AAM lying in bed in NAD, flat affect and slowed mentation but verbal and appropriate CVS:no rub Resp:cta EXB:MWUXLK Ext:s/p RBKA, left leg no edema   Recent Labs Lab 07/05/13 1328 07/06/13 0502 07/07/13 0619 07/08/13 0407 07/09/13 0620 07/10/13 0713  NA 136* 135* 134* 137 140 139  K 4.3 4.3 5.0 5.0 4.9 5.2  CL 96 98 97 100 101 104  CO2 22 20 26 23 21 23   GLUCOSE 94 97 104* 105* 93 98  BUN 23 19 19  33* 46* 54*  CREATININE 1.08 0.95 1.13 2.58* 4.09* 4.76*  ALBUMIN 3.0* 2.3*  --   --  2.0*  --   CALCIUM 10.3 9.5 9.6 9.5 9.2 9.4  AST 80* 50*  --   --  54*  --   ALT 97* 67*  --   --  42  --    Liver Function Tests:  Recent Labs Lab 07/05/13 1328 07/06/13 0502 07/09/13 0620  AST 80* 50* 54*  ALT 97* 67* 42  ALKPHOS 232* 196* 171*  BILITOT 1.0 1.1 0.8  PROT 9.6* 7.9 7.5  ALBUMIN 3.0* 2.3* 2.0*   No results found for this basename: LIPASE, AMYLASE,  in the last 168 hours No results found for this basename: AMMONIA,  in the last 168 hours CBC:  Recent Labs Lab 07/05/13 1328 07/06/13 0502 07/07/13 0619 07/08/13 0407 07/09/13 0620 07/10/13 0713  WBC 15.9* 14.2* 14.9* 14.1* 10.5 9.7  NEUTROABS 12.8*  --   --   --   --   --   HGB 14.6 12.6* 12.3* 11.8* 11.8* 11.2*  HCT 39.5 34.7* 34.9* 33.1* 33.3* 31.8*  MCV 89.6 89.0 89.7 89.7 90.0 90.1  PLT 378 378 333 354 319 305   Cardiac Enzymes:  Recent Labs Lab 07/05/13 1328 07/09/13 1615   CKTOTAL 281* 520*   CBG: No results found for this basename: GLUCAP,  in the last 168 hours  Iron Studies: No results found for this basename: IRON, TIBC, TRANSFERRIN, FERRITIN,  in the last 72 hours Studies/Results: US Renal  07/10/2013   CLINICAL DATA:  Acute renal failure  EXAM: RENAL/URINARY TRACT ULTRASOUND COMPLETE  COMPARISON:  CT ABD W/CM dated 08/02/2006  FINDINGS: Right Kidney:  Length: 11.4 cm. No mass or hydronephrosis. Mildly increased echogenicity.  Left Kidney:  Length: 12.2 cm. No mass or hydronephrosis. Mildly increased echogenicity.  Bladder:  Not evaluated well as it is decompressed by Foley catheter. There may be some degree of bladder wall thickening.  There appears to be sludge within the gallbladder.  IMPRESSION: 1. Medical renal disease 2. Bladder not evaluated well as it is decompressed by Foley catheter. Possible mild bladder wall thickening. 3. Gallbladder is only seen incidentally, but there appears to be sludge within it.   Electronically Signed   By: Esperanza Heir M.D.   On: 07/10/2013 10:01   .  amLODipine  5 mg Oral BID  . antiseptic oral rinse  15 mL Mouth Rinse BID  . docusate sodium  100 mg Oral BID  . heparin  5,000 Units Subcutaneous 3 times per day  . metoprolol tartrate  25 mg Oral BID    BMET    Component Value Date/Time   NA 139 07/10/2013 0713   K 5.2 07/10/2013 0713   CL 104 07/10/2013 0713   CO2 23 07/10/2013 0713   GLUCOSE 98 07/10/2013 0713   BUN 54* 07/10/2013 0713   CREATININE 4.76* 07/10/2013 0713   CALCIUM 9.4 07/10/2013 0713   GFRNONAA 11* 07/10/2013 0713   GFRAA 13* 07/10/2013 0713   CBC    Component Value Date/Time   WBC 9.7 07/10/2013 0713   RBC 3.53* 07/10/2013 0713   HGB 11.2* 07/10/2013 0713   HCT 31.8* 07/10/2013 0713   PLT 305 07/10/2013 0713   MCV 90.1 07/10/2013 0713   MCH 31.7 07/10/2013 0713   MCHC 35.2 07/10/2013 0713   RDW 13.3 07/10/2013 0713   LYMPHSABS 1.9 07/05/2013 1328   MONOABS 1.2* 07/05/2013 1328   EOSABS 0.0  07/05/2013 1328   BASOSABS 0.0 07/05/2013 1328     Assessment/Plan:  1. AKI in setting of infected leg and hypotension s/p RBKA.  No IV contrast and vanco level was 21.1.  Likely ischemic ATN as his UOP has finally increased over the last 24 hours, and the rate of rise in Scr has significantly slowed.  Hopefully will start to see improvement of Scr over the next 24-48 hours.  UA only + blood with only 3-5 RBC's, CPK <600.  Will cont to follow for now.   2. Hyperkalemia- secondary to #1.  rx medically with kayexalate and follow 3. 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 Abnormal LFT's - likely due to acute illness.  Improving  5. Dispo- possible CIR tx when stable  Chelsee Hosie A

## 2013-07-10 NOTE — Progress Notes (Signed)
Internal Medicine Attending  Date: 07/10/2013  Patient name: Verda Cuminsathaniel Crothers Medical record number: [id number] Date of birth: Jul 12, 1940 Age: 73 y.o. Gender: male  I saw and evaluated the patient. I reviewed the resident's note by Dr. Yetta BarreJones and I agree with the resident's findings and plans as documented in his progress note.  Mr. [name] is doing well from an infectious standpoint with no signs or symptoms of continued infection s/p BKA despite D/C of antibiotics yesterday.  Renal function is the main issue and the working diagnosis is ATN secondary to a possible hypotensive episode.  The differential also includes AIN and urine eosinophils are pending as a screen.  We are continuing supportive care and will make sure we don't fall behind a post-ATN diuresis if this is occuring.  He may be a candidate for CIR once renal situation improving and family has made final decision.

## 2013-07-10 NOTE — Progress Notes (Signed)
Inpt. Rehab  I spoke with pt's daughter Nonie HoyerMargie Thune by phone about her Dad's post acute rehab.  She would like to discuss with her Mom and her younger brother before making any decisions on his post op rehab. She plans to call me in the am to discuss their preference.    Weldon PickingSusan Yug Loria PT Inpatient Rehab Admissions Coordinator Cell [phone] Office [phone]

## 2013-07-11 LAB — BASIC METABOLIC PANEL
BUN: 53 mg/dL — AB (ref 6–23)
CALCIUM: 9.4 mg/dL (ref 8.4–10.5)
CO2: 22 mEq/L (ref 19–32)
CREATININE: 4.41 mg/dL — AB (ref 0.50–1.35)
Chloride: 104 mEq/L (ref 96–112)
GFR, EST AFRICAN AMERICAN: 14 mL/min — AB (ref >90)
GFR, EST NON AFRICAN AMERICAN: 12 mL/min — AB (ref >90)
GLUCOSE: 90 mg/dL (ref 70–99)
POTASSIUM: 4.6 meq/L (ref 3.7–5.3)
Sodium: 140 mEq/L (ref 137–147)

## 2013-07-11 LAB — CULTURE, BLOOD (ROUTINE X 2)
Culture: NO GROWTH
Culture: NO GROWTH

## 2013-07-11 NOTE — Progress Notes (Signed)
Inpt. Rehab  I spoke by phone with pt's daughter Nonie HoyerMargie Brede .  She states the family prefers SNF placement for his rehab instead of CIR.  Have contacted Genelle BalVanessa Crawford SW with this info.  Will sign off.  Please call if any questions.  Weldon PickingSusan Caydance Kuehnle PT Inpatient Rehab Admissions Coordinator Cell [phone] Office [phone]

## 2013-07-11 NOTE — Progress Notes (Signed)
Patient ID: Seth West, male   DOB: December 24, 1940, 73 y.o.   MRN: [id number] S:no new complaints O:BP 146/71  Pulse 78  Temp(Src) 98.9 F (37.2 C) (Oral)  Resp 18  Ht 5\' 9"  (1.753 m)  Wt 68.9 kg (151 lb 14.4 oz)  BMI 22.42 kg/m2  SpO2 99%  Intake/Output Summary (Last 24 hours) at 07/11/13 1418 Last data filed at 07/11/13 1300  Gross per 24 hour  Intake    120 ml  Output    650 ml  Net   -530 ml   Intake/Output: I/O last 3 completed shifts: In: -  Out: 1400 [Urine:1400]  Intake/Output this shift:  Total I/O In: 120 [P.O.:120] Out: 650 [Urine:650] Weight change: 0.8 kg (1 lb 12.2 oz) Gen:WD WN AAM in NAD CVS:no rub Resp:cta WUJ:WJXBJYAbd:benign Ext:no edema   Recent Labs Lab 07/05/13 1328 07/06/13 0502 07/07/13 0619 07/08/13 0407 07/09/13 0620 07/10/13 0713 07/10/13 1837 07/11/13 0355  NA 136* 135* 134* 137 140 139 139 140  K 4.3 4.3 5.0 5.0 4.9 5.2 5.2 4.6  CL 96 98 97 100 101 104 105 104  CO2 22 20 26 23 21 23 20 22   GLUCOSE 94 97 104* 105* 93 98 96 90  BUN 23 19 19  33* 46* 54* 53* 53*  CREATININE 1.08 0.95 1.13 2.58* 4.09* 4.76* 4.45* 4.41*  ALBUMIN 3.0* 2.3*  --   --  2.0*  --   --   --   CALCIUM 10.3 9.5 9.6 9.5 9.2 9.4 9.3 9.4  AST 80* 50*  --   --  54*  --   --   --   ALT 97* 67*  --   --  42  --   --   --    Liver Function Tests:  Recent Labs Lab 07/05/13 1328 07/06/13 0502 07/09/13 0620  AST 80* 50* 54*  ALT 97* 67* 42  ALKPHOS 232* 196* 171*  BILITOT 1.0 1.1 0.8  PROT 9.6* 7.9 7.5  ALBUMIN 3.0* 2.3* 2.0*   No results found for this basename: LIPASE, AMYLASE,  in the last 168 hours No results found for this basename: AMMONIA,  in the last 168 hours CBC:  Recent Labs Lab 07/05/13 1328 07/06/13 0502 07/07/13 0619 07/08/13 0407 07/09/13 0620 07/10/13 0713  WBC 15.9* 14.2* 14.9* 14.1* 10.5 9.7  NEUTROABS 12.8*  --   --   --   --   --   HGB 14.6 12.6* 12.3* 11.8* 11.8* 11.2*  HCT 39.5 34.7* 34.9* 33.1* 33.3* 31.8*  MCV 89.6 89.0 89.7  89.7 90.0 90.1  PLT 378 378 333 354 319 305   Cardiac Enzymes:  Recent Labs Lab 07/05/13 1328 07/09/13 1615  CKTOTAL 281* 520*   CBG: No results found for this basename: GLUCAP,  in the last 168 hours  Iron Studies: No results found for this basename: IRON, TIBC, TRANSFERRIN, FERRITIN,  in the last 72 hours Studies/Results: [place] Renal  07/10/2013   CLINICAL DATA:  Acute renal failure  EXAM: RENAL/URINARY TRACT ULTRASOUND COMPLETE  COMPARISON:  CT ABD W/CM dated 08/02/2006  FINDINGS: Right Kidney:  Length: 11.4 cm. No mass or hydronephrosis. Mildly increased echogenicity.  Left Kidney:  Length: 12.2 cm. No mass or hydronephrosis. Mildly increased echogenicity.  Bladder:  Not evaluated well as it is decompressed by Foley catheter. There may be some degree of bladder wall thickening.  There appears to be sludge within the gallbladder.  IMPRESSION: 1. Medical renal disease 2. Bladder not evaluated well  as it is decompressed by Foley catheter. Possible mild bladder wall thickening. 3. Gallbladder is only seen incidentally, but there appears to be sludge within it.   Electronically Signed   By: Esperanza Heir M.D.   On: 07/10/2013 10:01   . amLODipine  5 mg Oral BID  . antiseptic oral rinse  15 mL Mouth Rinse BID  . docusate sodium  100 mg Oral BID  . heparin  5,000 Units Subcutaneous 3 times per day  . metoprolol tartrate  25 mg Oral BID    BMET    Component Value Date/Time   NA 140 07/11/2013 0355   K 4.6 07/11/2013 0355   CL 104 07/11/2013 0355   CO2 22 07/11/2013 0355   GLUCOSE 90 07/11/2013 0355   BUN 53* 07/11/2013 0355   CREATININE 4.41* 07/11/2013 0355   CALCIUM 9.4 07/11/2013 0355   GFRNONAA 12* 07/11/2013 0355   GFRAA 14* 07/11/2013 0355   CBC    Component Value Date/Time   WBC 9.7 07/10/2013 0713   RBC 3.53* 07/10/2013 0713   HGB 11.2* 07/10/2013 0713   HCT 31.8* 07/10/2013 0713   PLT 305 07/10/2013 0713   MCV 90.1 07/10/2013 0713   MCH 31.7 07/10/2013 0713   MCHC 35.2 07/10/2013  0713   RDW 13.3 07/10/2013 0713   LYMPHSABS 1.9 07/05/2013 1328   MONOABS 1.2* 07/05/2013 1328   EOSABS 0.0 07/05/2013 1328   BASOSABS 0.0 07/05/2013 1328     Assessment/Plan:  1. AKI in setting of infected leg and hypotension s/p RBKA. No IV contrast and vanco level was 21.1.  1. Likely ischemic ATN as his UOP has finally increased over the last 48 hours, and his Scr has finally started to trend down.  Will cont to follow for now.  2. Hyperkalemia- secondary to #1. Improved after being rx medically with kayexalate  3. 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 Abnormal LFT's - likely due to acute illness. Improving  5. ABLA- follow H/H 6. Dispo- possible CIR tx when stable 7.   Serenity Fortner A

## 2013-07-11 NOTE — Progress Notes (Signed)
[facility]  Subjective  - no new complaints.  He is unable to actively extend his left knee.   Objective 150/83 79 98.9 F (37.2 C) (Oral) 18 95% No intake or output data in the 24 hours ending 07/11/13 0813  Incision is clean and dry. No drainage or erythema. Knee contracture New verses old?  Assessment/Planning: POD #3  Right BKA CIR pending  Clinton GallantCOLLINS, Arlind Klingerman [facility] IncMAUREEN 07/11/2013 8:13 AM --  Laboratory Lab Results:  Recent Labs  07/09/13 0620 07/10/13 0713  WBC 10.5 9.7  HGB 11.8* 11.2*  HCT 33.3* 31.8*  PLT 319 305   BMET  Recent Labs  07/10/13 1837 07/11/13 0355  NA 139 140  K 5.2 4.6  CL 105 104  CO2 20 22  GLUCOSE 96 90  BUN 53* 53*  CREATININE 4.45* 4.41*  CALCIUM 9.3 9.4    COAG Lab Results  Component Value Date   INR 1.13 07/07/2013   No results found for this basename: PTT

## 2013-07-11 NOTE — Progress Notes (Signed)
Subjective: Patient seen at bedside this AM, no new complaints. Says he has slept okay. Appears slightly depressed, interested in chaplain visit. Denies fever, chills, nausea, vomiting. Improved renal function today.   Discharge to CIR was planned, however, daughter prefers SNF placement at this time.   Objective:  Vital signs in last 24 hours: Filed Vitals:   07/10/13 0941 07/10/13 1307 07/10/13 1757 07/10/13 2100  BP: 156/80 152/74 158/80 150/83  Pulse: 85 78 76 79  Temp: 98.6 F (37 C) 98.6 F (37 C) 98.7 F (37.1 C) 98.9 F (37.2 C)  TempSrc: Oral Oral Oral Oral  Resp: 18 19 18 18   Weight:    153 lb (69.4 kg)  SpO2: 98% 100% 100% 95%   Weight change: 1 lb 12.2 oz (0.8 kg) No intake or output data in the 24 hours ending 07/11/13 0851 Physical Exam: General: Alert, cooperative, and in no apparent distress. Slightly flat affect. HEENT: EOMI, PERRL Neck: Full range of motion without pain Lungs: Clear to ascultation bilaterally, normal work of respiration, no wheezes, rales, ronchi Heart: Regular rate and rhythm, no murmurs, gallops, or rubs Abdomen: Soft, non-tender, non-distended, normal bowel sounds Extremities: R BKA wrapped w/ ACE bandage. LLE w/out ulcers or lesions. No edema.  Neurologic: Alert & oriented X3, cranial nerves II-XII intact, no focal neurological deficits.    Lab Results: Basic Metabolic Panel:  Recent Labs Lab 07/09/13 0620 07/10/13 0713 07/10/13 1837 07/11/13 0355  NA 140 139 139 140  K 4.9 5.2 5.2 4.6  CL 101 104 105 104  CO2 21 23 20 22   GLUCOSE 93 98 96 90  BUN 46* 54* 53* 53*  CREATININE 4.09* 4.76* 4.45* 4.41*  CALCIUM 9.2 9.4 9.3 9.4  MG  --  2.5  --   --    Liver Function Tests:  Recent Labs Lab 07/06/13 0502 07/09/13 0620  AST 50* 54*  ALT 67* 42  ALKPHOS 196* 171*  BILITOT 1.1 0.8  PROT 7.9 7.5  ALBUMIN 2.3* 2.0*   CBC:  Recent Labs Lab 07/05/13 1328  07/09/13 0620 07/10/13 0713  WBC 15.9*  < > 10.5 9.7    NEUTROABS 12.8*  --   --   --   HGB 14.6  < > 11.8* 11.2*  HCT 39.5  < > 33.3* 31.8*  MCV 89.6  < > 90.0 90.1  PLT 378  < > 319 305  < > = values in this interval not displayed. Cardiac Enzymes:  Recent Labs Lab 07/05/13 1328 07/09/13 1615  CKTOTAL 281* 520*   Hemoglobin A1C:  Recent Labs Lab 07/06/13 0502  HGBA1C 5.4    Fasting Lipid Panel:  Recent Labs Lab 07/06/13 0502  CHOL 107  HDL 45  LDLCALC 51  TRIG 53  CHOLHDL 2.4   Coagulation:  Recent Labs Lab 07/07/13 0619  LABPROT 14.3  INR 1.13   Urine Drug Screen: Drugs of Abuse     Component Value Date/Time   LABOPIA NONE DETECTED 07/07/2013 1815    Alcohol Level:  Recent Labs Lab 07/05/13 2027  ETH <11   Urinalysis:  Recent Labs Lab 07/07/13 1815 07/09/13 1627  COLORURINE AMBER* YELLOW  LABSPEC 1.016 1.018  PHURINE 6.0 5.0  GLUCOSEU NEGATIVE NEGATIVE  HGBUR NEGATIVE MODERATE*  BILIRUBINUR NEGATIVE NEGATIVE  KETONESUR NEGATIVE NEGATIVE  PROTEINUR NEGATIVE NEGATIVE  UROBILINOGEN 4.0* 1.0  NITRITE NEGATIVE NEGATIVE  LEUKOCYTESUR NEGATIVE NEGATIVE   Micro Results: Recent Results (from the past 240 hour(s))  CULTURE, BLOOD (ROUTINE X  2)     Status: None   Collection Time    07/05/13  1:20 PM      Result Value Ref Range Status   Specimen Description BLOOD HAND RIGHT   Final   Special Requests BOTTLES DRAWN AEROBIC ONLY 10CC   Final   Culture  Setup Time     Final   Value: 07/05/2013 21:02     Performed at [facility]   Culture     Final   Value: NO GROWTH 5 DAYS     Performed at [facility]   Report Status 07/11/2013 FINAL   Final  CULTURE, BLOOD (ROUTINE X 2)     Status: None   Collection Time    07/05/13  1:30 PM      Result Value Ref Range Status   Specimen Description BLOOD RIGHT FOREARM   Final   Special Requests     Final   Value: BOTTLES DRAWN AEROBIC AND ANAEROBIC 10CCBLUE  5CCRED   Culture  Setup Time     Final   Value: 07/05/2013 21:03     Performed  at [facility]   Culture     Final   Value: NO GROWTH 5 DAYS     Performed at [facility]   Report Status 07/11/2013 FINAL   Final   Medications: I have reviewed the patient's current medications. Scheduled Meds: . amLODipine  5 mg Oral BID  . antiseptic oral rinse  15 mL Mouth Rinse BID  . docusate sodium  100 mg Oral BID  . heparin  5,000 Units Subcutaneous 3 times per day  . metoprolol tartrate  25 mg Oral BID   Continuous Infusions:   PRN Meds:.morphine injection, oxyCODONE  Assessment/Plan: Mr. Seth Seth is a 73 y.o. male w/ PMHx of HTN, presents to the ED w/ complaints of worsening right foot wound, admitted for RLE gangrene, now s/p R BKA.   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 Blood cultures negative. No issues at this time. Patient was planned for CIR placement for rehab, however, family declined, would rather SNF placement. -Continue incentive spirometry -Oxycodone 5 mg q4h prn and morphine 1-2mg  q2 PRN -Pending SNF placement  AKI- Patient w/ elevation in Cr (baseline ~1.0), likely d/t ischemic ATN. Improved overnight. Urine output does not reflect polyuria, however, poor I/O data from overnight. Cr trend as follows:  Recent Labs Lab 07/08/13 0407 07/09/13 0620 07/10/13 0713 07/10/13 1837 07/11/13 0355  CREATININE 2.58* 4.09* 4.76* 4.45* 4.41*  -Renal following, feel that AKI is 2/2 ischemic ATN.  -Will hold fluids for now, monitoring closely for post-ATN polyuria. -Monitor strict I/O's.  HTN: Patient w/ h/o HTN, not on any medications at home. Stable -Continue Metoprolol 25 mg bid  -Continue Norvasc to 10 mg po qd -Continue to monitor    DVT/PE PPx: Heparin Seth Seth  Dispo: Disposition is deferred at this time, awaiting improvement of current medical problems.  Anticipated discharge in approximately 1-2 day(s). Pending SNF placement at this time.  The patient does not have a current PCP (Pcp Not In System) and does need an [facility]  hospital follow-up appointment after discharge.  The patient does not have transportation limitations that hinder transportation to clinic appointments.  Services Needed at time of discharge: Y = Yes, Blank = No PT:   OT:   RN:   Equipment:   Other:     LOS: 6 days   Seth Paris, MD 07/11/2013, 8:51  AM Pager: [phone]

## 2013-07-11 NOTE — Clinical Social Work Note (Signed)
CSW received consult re: SNF placement for patient. Call made to daughter (full assessment to follow) and she desires SNF in a skilled facility for patient. CSW will initiate SNF search and follow-up with daughter with facility responses.  Genelle BalVanessa Skyler Carel, MSW, LCSW [phone]

## 2013-07-11 NOTE — Progress Notes (Signed)
Internal Medicine Attending  Date: 07/11/2013  Patient name: Seth West Medical record number: [id number] Date of birth: 02/05/1941 Age: 73 y.o. Gender: male  I saw and evaluated the patient. I reviewed the resident's note by Dr. Yetta BarreJones and I agree with the resident's findings and plans as documented in his progress note.  Patient's affect is somewhat depressed this AM.  Had a chaplin visit today.  Family interested in transfer to SNF rather than CIR.  Creatinine slightly improved.  Watching closely for post-ATN diuresis.  Should be medically stable to go to SNF in next 24-48 hours.

## 2013-07-11 NOTE — Progress Notes (Signed)
Chaplain responded to spiritual care consult. Chaplain observed patient's gaze moved around room, hallway, and out the window, and he fidgeted with his hands. Chaplain provided emotional support, prayer, and caring presence. Please page for follow up.   Maurene CapesHillary D [facility]: [phone]

## 2013-07-12 ENCOUNTER — Encounter (HOSPITAL_COMMUNITY): Payer: Self-pay | Admitting: Vascular Surgery

## 2013-07-12 LAB — RENAL FUNCTION PANEL
ALBUMIN: 2.2 g/dL — AB (ref 3.5–5.2)
BUN: 54 mg/dL — AB (ref 6–23)
CALCIUM: 9.8 mg/dL (ref 8.4–10.5)
CO2: 22 mEq/L (ref 19–32)
CREATININE: 4.26 mg/dL — AB (ref 0.50–1.35)
Chloride: 102 mEq/L (ref 96–112)
GFR calc Af Amer: 14 mL/min — ABNORMAL LOW (ref >90)
GFR, EST NON AFRICAN AMERICAN: 12 mL/min — AB (ref >90)
Glucose, Bld: 102 mg/dL — ABNORMAL HIGH (ref 70–99)
PHOSPHORUS: 5 mg/dL — AB (ref 2.3–4.6)
Potassium: 4.4 mEq/L (ref 3.7–5.3)
Sodium: 138 mEq/L (ref 137–147)

## 2013-07-12 MED ORDER — SODIUM CHLORIDE 0.9 % IV SOLN
INTRAVENOUS | Status: DC
  Administered 2013-07-12: 13:00:00 via INTRAVENOUS

## 2013-07-12 MED ORDER — AMLODIPINE BESYLATE 5 MG PO TABS: 5.0000 mg | ORAL_TABLET | Freq: Two times a day (BID) | ORAL | Status: DC

## 2013-07-12 MED ORDER — METOPROLOL TARTRATE 25 MG PO TABS: 25.0000 mg | ORAL_TABLET | Freq: Two times a day (BID) | ORAL | Status: DC

## 2013-07-12 MED ORDER — OXYCODONE HCL 5 MG PO TABS: 5.0000 mg | ORAL_TABLET | ORAL | Status: DC | PRN

## 2013-07-12 NOTE — Progress Notes (Signed)
Internal Medicine Attending  Date: 07/12/2013  Patient name: Seth West Medical record number: [id number] Date of birth: 1940-09-21 Age: 73 y.o. Gender: male  I saw and evaluated the patient. I reviewed the resident's note by Dr. Yetta BarreJones and I agree with the resident's findings and plans as documented in his progress note.  [name] is w/o specific complaints this AM.  He is being transferred to a SNF for further post-operative rehab.

## 2013-07-12 NOTE — Progress Notes (Signed)
Subjective:  Patient seen at bedside this AM, no new complaints.  Awaiting SNF placement  Objective:  Vital signs in last 24 hours: Filed Vitals:   07/11/13 2100 07/11/13 2125 07/12/13 0519 07/12/13 0829  BP: 148/78  151/77 130/74  Pulse: 75  79 82  Temp: 98.1 F (36.7 C)  98.3 F (36.8 C) 97.4 F (36.3 C)  TempSrc: Oral  Oral Axillary  Resp: 18  18 18   Height:      Weight:  148 lb 2.4 oz (67.2 kg)    SpO2: 98%  97% 99%   Weight change: -1 lb 1.6 oz (-0.5 kg)  Intake/Output Summary (Last 24 hours) at 07/12/13 1159 Last data filed at 07/12/13 0520  Gross per 24 hour  Intake    360 ml  Output   1450 ml  Net  -1090 ml   Physical Exam: General: Alert, cooperative, and in no apparent distress. Seems less depressed today. HEENT: EOMI, PERRL Neck: Full range of motion without pain Lungs: Clear to ascultation bilaterally, normal work of respiration, no wheezes, rales, ronchi Heart: Regular rate and rhythm, no murmurs, gallops, or rubs Abdomen: Soft, non-tender, non-distended, normal bowel sounds Extremities: R. BKA appears clean, dry, intact. Staple line and flap appear clean, without drainage, well perfused. LLE w/out ulcers or lesions. No edema.  Neurologic: Alert & oriented X3, cranial nerves II-XII intact, no focal neurological deficits.    Lab Results: Basic Metabolic Panel:  Recent Labs Lab 07/09/13 0620 07/10/13 0713  07/11/13 0355 07/12/13 0526  NA 140 139  < > 140 138  K 4.9 5.2  < > 4.6 4.4  CL 101 104  < > 104 102  CO2 21 23  < > 22 22  GLUCOSE 93 98  < > 90 102*  BUN 46* 54*  < > 53* 54*  CREATININE 4.09* 4.76*  < > 4.41* 4.26*  CALCIUM 9.2 9.4  < > 9.4 9.8  MG  --  2.5  --   --   --   PHOS  --   --   --   --  5.0*  < > = values in this interval not displayed. Liver Function Tests:  Recent Labs Lab 07/06/13 0502 07/09/13 0620 07/12/13 0526  AST 50* 54*  --   ALT 67* 42  --   ALKPHOS 196* 171*  --   BILITOT 1.1 0.8  --   PROT 7.9 7.5  --    ALBUMIN 2.3* 2.0* 2.2*   CBC:  Recent Labs Lab 07/05/13 1328  07/09/13 0620 07/10/13 0713  WBC 15.9*  < > 10.5 9.7  NEUTROABS 12.8*  --   --   --   HGB 14.6  < > 11.8* 11.2*  HCT 39.5  < > 33.3* 31.8*  MCV 89.6  < > 90.0 90.1  PLT 378  < > 319 305  < > = values in this interval not displayed. Cardiac Enzymes:  Recent Labs Lab 07/05/13 1328 07/09/13 1615  CKTOTAL 281* 520*   Hemoglobin A1C:  Recent Labs Lab 07/06/13 0502  HGBA1C 5.4    Fasting Lipid Panel:  Recent Labs Lab 07/06/13 0502  CHOL 107  HDL 45  LDLCALC 51  TRIG 53  CHOLHDL 2.4   Coagulation:  Recent Labs Lab 07/07/13 0619  LABPROT 14.3  INR 1.13   Urine Drug Screen: Drugs of Abuse     Component Value Date/Time   LABOPIA NONE DETECTED 07/07/2013 1815    Alcohol Level:  Recent Labs Lab 07/05/13 2027  ETH <11   Urinalysis:  Recent Labs Lab 07/07/13 1815 07/09/13 1627  COLORURINE AMBER* YELLOW  LABSPEC 1.016 1.018  PHURINE 6.0 5.0  GLUCOSEU NEGATIVE NEGATIVE  HGBUR NEGATIVE MODERATE*  BILIRUBINUR NEGATIVE NEGATIVE  KETONESUR NEGATIVE NEGATIVE  PROTEINUR NEGATIVE NEGATIVE  UROBILINOGEN 4.0* 1.0  NITRITE NEGATIVE NEGATIVE  LEUKOCYTESUR NEGATIVE NEGATIVE   Micro Results: Recent Results (from the past 240 hour(s))  CULTURE, BLOOD (ROUTINE X 2)     Status: None   Collection Time    07/05/13  1:20 PM      Result Value Ref Range Status   Specimen Description BLOOD HAND RIGHT   Final   Special Requests BOTTLES DRAWN AEROBIC ONLY 10CC   Final   Culture  Setup Time     Final   Value: 07/05/2013 21:02     Performed at [facility]   Culture     Final   Value: NO GROWTH 5 DAYS     Performed at [facility]   Report Status 07/11/2013 FINAL   Final  CULTURE, BLOOD (ROUTINE X 2)     Status: None   Collection Time    07/05/13  1:30 PM      Result Value Ref Range Status   Specimen Description BLOOD RIGHT FOREARM   Final   Special Requests     Final   Value:  BOTTLES DRAWN AEROBIC AND ANAEROBIC 10CCBLUE  5CCRED   Culture  Setup Time     Final   Value: 07/05/2013 21:03     Performed at [facility]   Culture     Final   Value: NO GROWTH 5 DAYS     Performed at [facility]   Report Status 07/11/2013 FINAL   Final   Medications: I have reviewed the patient's current medications. Scheduled Meds: . amLODipine  5 mg Oral BID  . antiseptic oral rinse  15 mL Mouth Rinse BID  . docusate sodium  100 mg Oral BID  . heparin  5,000 Units Subcutaneous 3 times per day  . metoprolol tartrate  25 mg Oral BID   Continuous Infusions:   PRN Meds:.morphine injection, oxyCODONE  Assessment/Plan: [name] is a 73 y.o. male w/ PMHx of HTN, presents to the ED w/ complaints of worsening right foot wound, admitted for RLE gangrene, now s/p R BKA.   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  No issues at this time. Stump appears clean, dry and intact, well perfused, no purulence or drainage. Awaiting SNF placement. -Oxycodone 5 mg q4h prn and morphine 1-2mg  q2 PRN  AKI- Likely d/t ischemic ATN. Improving. Urine output increased, net -1.3L overnight. Cr trend as follows:  Recent Labs Lab 07/09/13 0620 07/10/13 0713 07/10/13 1837 07/11/13 0355 07/12/13 0526  CREATININE 4.09* 4.76* 4.45* 4.41* 4.26*  -Start NS @ 100 ml/hr for 10 hours w/ increased UO. -Continue to monitor strict I/O's.  HTN: Patient w/ h/o HTN, not on any medications at home. Stable -Continue Metoprolol 25 mg bid  -Continue Norvasc to 10 mg po qd -Continue to monitor    DVT/PE PPx: Heparin [place]  Dispo: Disposition is deferred at this time, awaiting improvement of current medical problems.  Anticipated discharge in approximately 1-2 day(s). Pending SNF placement at this time.  The patient does not have a current PCP (Pcp Not In System) and does need an [facility] hospital follow-up appointment after discharge.  The patient does not have  transportation limitations  that hinder transportation to clinic appointments.  Services Needed at time of discharge: Y = Yes, Blank = No PT:   OT:   RN:   Equipment:   Other:     LOS: 7 days   Seth Paris, MD 07/12/2013, 11:58 AM Pager: [phone]

## 2013-07-12 NOTE — Clinical Social Work Placement (Addendum)
Clinical Social Work Department CLINICAL SOCIAL WORK PLACEMENT NOTE 07/12/2013  Patient:  [name],Sonia  Account Number:  [id number] Admit date:  07/05/2013  Clinical Social Worker:  Genelle BalVANESSA Benford Asch, LCSW  Date/time:  07/12/2013 12:24 PM  Clinical Social Work is seeking post-discharge placement for this patient at the following level of care:   SKILLED NURSING   (*CSW will update this form in Epic as items are completed)   07/11/2013  Patient/family provided with [facility] Department of Clinical Social Work's list of facilities offering this level of care within the geographic area requested by the patient (or if unable, by the patient's family).  07/11/2013  Patient/family informed of their freedom to choose among providers that offer the needed level of care, that participate in Medicare, Medicaid or managed care program needed by the patient, have an available bed and are willing to accept the patient.    Patient/family informed of [facility]' ownership interest in [facility], as well as of the fact that they are under no obligation to receive care at this facility.  PASARR submitted to EDS on 07/12/2013 PASARR number received from EDS on 07/12/2013  FL2 transmitted to all facilities in geographic area requested by pt/family on  07/12/2013 FL2 transmitted to all facilities within larger geographic area on   Patient informed that his/her managed care company has contracts with or will negotiate with  certain facilities, including the following:     Patient/family informed of bed offers received: 07/12/13  [facility] Physician recommends and patient chooses bed at    [facility] on 07/12/13   Patient to be transferred to facility by ambulance  The following physician request were entered in Epic:   Additional Comments:

## 2013-07-12 NOTE — Progress Notes (Signed)
Utilization review completed.  

## 2013-07-12 NOTE — Progress Notes (Signed)
[facility]. Report called to Santo HeldJoy Ollison, RN.  Foley catheter removed, voiding without difficulty.  Iv removed. EMS notified.  Terrance Massina Wladyslawa Disbro, RN.

## 2013-07-12 NOTE — Clinical Social Work Psychosocial (Addendum)
Clinical Social Work Department BRIEF PSYCHOSOCIAL ASSESSMENT 07/12/2013  Patient:  Seth West,Seth West     Account Number:  [id number]     Admit date:  07/05/2013  Clinical Social Worker:  Delmer IslamRAWFORD,Jarelyn Bambach, LCSW  Date/Time:  07/12/2013 12:10 PM  Referred by:  Physician  Date Referred:  07/11/2013 Referred for  SNF Placement   Other Referral:   Interview type:  Family Other interview type:   CSW talked with patient's daughter Seth HoyerMargie West [phone])    PSYCHOSOCIAL DATA Living Status:  FRIEND(S) Admitted from facility:   Level of care:   Primary support name:  Seth HoyerMargie Shimmel Primary support relationship to patient:  CHILD, ADULT Degree of support available:   Daughter concerned about patient medial status and his living arrangement once discharged from ST rehab    CURRENT CONCERNS Current Concerns  Post-Acute Placement   Other Concerns:   Living arrangement post ST rehab    SOCIAL WORK ASSESSMENT / PLAN On 07/11/13 CSW talked by phone with patient's daughter, Ms. Seth West regarding d/c plans and recommendation of ST rehab. Daughter was aware as she had been contacted about inpatient rehab. Daughter explained that she would prefer that patient go to ST rehab as he can be there longer. She also reported that the person her father was living with has put him out, so she will have to assist with finding him somewhere else to live. Housing options/resources were discussed.    Daughter informed CSW that she works at [employer] and she may consider patient coming to that facility. Her other preferences include [facility]. CSW explained facility search process and advised her that a list of facilities will be placed in patient's room.   Assessment/plan status:  Psychosocial Support/Ongoing Assessment of Needs Other assessment/ plan:   Information/referral to community resources:   List of [facility] placed in patient's room on 07/11/13 and CSW will provide  daughter with [employer] [facility].    PATIENT'S/FAMILY'S RESPONSE TO PLAN OF CARE: Daughter was receptive to talking with CSW about discharge plans. Daughter is very concerned about patient's living arrangements once discharged. CSW listened, offered support and resources.

## 2013-07-12 NOTE — Progress Notes (Signed)
Physical Therapy Treatment Patient Details Name: Seth West MRN: [id number] DOB: 1940-08-25 Today's Date: 07/12/2013 Time: 4098-1191 PT Time Calculation (min): 25 min  PT Assessment / Plan / Recommendation  History of Present Illness Patient is a 73 yo male admitted with infection and chronic ischemia Rt foot.  Patient now s/p Rt BKA.   PT Comments   Pt progressing slowly, stood with more confidence today and better support of LLE, however, still requiring use of Steady equipment, insufficient strength for pivot pr ambulation with RW. Family requesting SNF placement, agree that this is an appropriate d/c plan. Pt with flexion contracture right knee, educated on proper positioning, but poor carryover between sessions. PT will continue to follow.   Follow Up Recommendations  SNF;Supervision/Assistance - 24 hour     Does the patient have the potential to tolerate intense rehabilitation     Barriers to Discharge        Equipment Recommendations  Wheelchair (measurements PT);Wheelchair cushion (measurements PT)    Recommendations for Other Services    Frequency Min 3X/week   Progress towards PT Goals Progress towards PT goals: Progressing toward goals  Plan Discharge plan needs to be updated    Precautions / Restrictions Precautions Precautions: Fall Restrictions Weight Bearing Restrictions: No   Pertinent Vitals/Pain No c/o pain    Mobility  Bed Mobility Overal bed mobility: Needs Assistance Bed Mobility: Supine to Sit Supine to sit: Min assist General bed mobility comments: pt improving with bed mobility, able to get self to EOB with rail but needed min A to scoot out as he kept losing balance to left when wt-shifting left due to decreased wt of right leg.  Transfers Overall transfer level: Needs assistance Equipment used:  (steady) Transfers: Sit to/from Stand Sit to Stand: +2 physical assistance;Max assist General transfer comment: stood 2x to steady with increased  success. Pt was able to achieve erect posture sufficient for putting flaps down. Practiced wt-shifting fwd and extending hips as well as relaxing right hip flexor in standing. PT placed hand on pt's left knee with extension force during sit to stand which helped standing. Pt keeps wt heavy towards left during standing despite cues to take wt through RUE. Max A needed for stand to sit as pt does not trust LLE to lower him safely down. Ambulation/Gait General Gait Details: insufficient strength of LLE at this time    Exercises General Exercises - Lower Extremity Quad Sets: AROM;Right;10 reps;Seated;Limitations Quad Sets Limitations: flexion contracture right knee Amputee Exercises Hip Extension: AROM;5 reps;Standing Knee Extension: AROM;10 reps;Supine;Right Straight Leg Raises: AROM;Right;10 reps;Seated   PT Diagnosis:    PT Problem List:   PT Treatment Interventions:     PT Goals (current goals can now be found in the care plan section) Acute Rehab PT Goals Patient Stated Goal: none stated PT Goal Formulation: With patient Time For Goal Achievement: 07/16/13 Potential to Achieve Goals: Good  Visit Information  Last PT Received On: 07/12/13 Assistance Needed: +2 History of Present Illness: Patient is a 73 yo male admitted with infection and chronic ischemia Rt foot.  Patient now s/p Rt BKA.    Subjective Data  Subjective: pt anxious about trying to get OOB Patient Stated Goal: none stated   Cognition  Cognition Arousal/Alertness: Awake/alert Behavior During Therapy: Flat affect Overall Cognitive Status: No family/caregiver present to determine baseline cognitive functioning Memory: Decreased short-term memory    Balance  Balance Overall balance assessment: Needs assistance Sitting-balance support: Bilateral upper extremity supported Sitting balance-Leahy Scale:  Fair Sitting balance - Comments: poor balance at first with left lean but improved to fair with time and practice  wt-shifting and getting used to decreased wt on right side Postural control: Left lateral lean Standing balance support: Bilateral upper extremity supported;During functional activity Standing balance-Leahy Scale: Zero  End of Session PT - End of Session Equipment Utilized During Treatment: Gait belt Activity Tolerance: Patient tolerated treatment well Patient left: in chair;with call bell/phone within reach Nurse Communication: Mobility status   GP   Lyanne CoVictoria Hildagarde Holleran, PT  Acute Rehab Services  [phone]   Lyanne CoManess, [place] Sun 07/12/2013, 3:44 PM

## 2013-07-12 NOTE — Progress Notes (Signed)
Patient ID: Seth West, male   DOB: 08-Jul-1940, 73 y.o.   MRN: [id number] S:No complaints O:BP 111/66  Pulse 73  Temp(Src) 98 F (36.7 C) (Axillary)  Resp 19  Ht 5\' 9"  (1.753 m)  Wt 67.2 kg (148 lb 2.4 oz)  BMI 21.87 kg/m2  SpO2 100%  Intake/Output Summary (Last 24 hours) at 07/12/13 1314 Last data filed at 07/12/13 0520  Gross per 24 hour  Intake    360 ml  Output   1000 ml  Net   -640 ml   Intake/Output: I/O last 3 completed shifts: In: 480 [P.O.:480] Out: 1800 [Urine:1800]  Intake/Output this shift:    Weight change: -0.5 kg (-1 lb 1.6 oz) Gen:WD AAM in NAD CVS:no rub Resp:cta MVH:QIONGEAbd:benign Ext:no edema   Recent Labs Lab 07/05/13 1328 07/06/13 0502 07/07/13 [id number] 07/08/13 0407 07/09/13 0620 07/10/13 0713 07/10/13 1837 07/11/13 0355 07/12/13 0526  NA 136* 135* 134* 137 140 139 139 140 138  K 4.3 4.3 5.0 5.0 4.9 5.2 5.2 4.6 4.4  CL 96 98 97 100 101 104 105 104 102  CO2 22 20 26 23 21 23 20 22 22   GLUCOSE 94 97 104* 105* 93 98 96 90 102*  BUN 23 19 19  33* 46* 54* 53* 53* 54*  CREATININE 1.08 0.95 1.13 2.58* 4.09* 4.76* 4.45* 4.41* 4.26*  ALBUMIN 3.0* 2.3*  --   --  2.0*  --   --   --  2.2*  CALCIUM 10.3 9.5 9.6 9.5 9.2 9.4 9.3 9.4 9.8  PHOS  --   --   --   --   --   --   --   --  5.0*  AST 80* 50*  --   --  54*  --   --   --   --   ALT 97* 67*  --   --  42  --   --   --   --    Liver Function Tests:  Recent Labs Lab 07/05/13 1328 07/06/13 0502 07/09/13 0620 07/12/13 0526  AST 80* 50* 54*  --   ALT 97* 67* 42  --   ALKPHOS 232* 196* 171*  --   BILITOT 1.0 1.1 0.8  --   PROT 9.6* 7.9 7.5  --   ALBUMIN 3.0* 2.3* 2.0* 2.2*   No results found for this basename: LIPASE, AMYLASE,  in the last 168 hours No results found for this basename: AMMONIA,  in the last 168 hours CBC:  Recent Labs Lab 07/05/13 1328 07/06/13 0502 07/07/13 0619 07/08/13 0407 07/09/13 0620 07/10/13 0713  WBC 15.9* 14.2* 14.9* 14.1* 10.5 9.7  NEUTROABS 12.8*  --   --    --   --   --   HGB 14.6 12.6* 12.3* 11.8* 11.8* 11.2*  HCT 39.5 34.7* 34.9* 33.1* 33.3* 31.8*  MCV 89.6 89.0 89.7 89.7 90.0 90.1  PLT 378 378 333 354 319 305   Cardiac Enzymes:  Recent Labs Lab 07/05/13 1328 07/09/13 1615  CKTOTAL 281* 520*   CBG: No results found for this basename: GLUCAP,  in the last 168 hours  Iron Studies: No results found for this basename: IRON, TIBC, TRANSFERRIN, FERRITIN,  in the last 72 hours Studies/Results: No results found. Marland Kitchen. amLODipine  5 mg Oral BID  . antiseptic oral rinse  15 mL Mouth Rinse BID  . docusate sodium  100 mg Oral BID  . heparin  5,000 Units Subcutaneous 3 times per day  . metoprolol  tartrate  25 mg Oral BID    BMET    Component Value Date/Time   NA 138 07/12/2013 0526   K 4.4 07/12/2013 0526   CL 102 07/12/2013 0526   CO2 22 07/12/2013 0526   GLUCOSE 102* 07/12/2013 0526   BUN 54* 07/12/2013 0526   CREATININE 4.26* 07/12/2013 0526   CALCIUM 9.8 07/12/2013 0526   GFRNONAA 12* 07/12/2013 0526   GFRAA 14* 07/12/2013 0526   CBC    Component Value Date/Time   WBC 9.7 07/10/2013 0713   RBC 3.53* 07/10/2013 0713   HGB 11.2* 07/10/2013 0713   HCT 31.8* 07/10/2013 0713   PLT 305 07/10/2013 0713   MCV 90.1 07/10/2013 0713   MCH 31.7 07/10/2013 0713   MCHC 35.2 07/10/2013 0713   RDW 13.3 07/10/2013 0713   LYMPHSABS 1.9 07/05/2013 1328   MONOABS 1.2* 07/05/2013 1328   EOSABS 0.0 07/05/2013 1328   BASOSABS 0.0 07/05/2013 1328     Assessment/Plan:  1. AKI in setting of infected leg and hypotension s/p RBKA. No IV contrast and vanco level was 21.1.  1. Likely ischemic ATN 2. Cont to follow his UOP 3. Scr continues to slowly trend down.  4. Will cont to follow for now.  2. Hyperkalemia- secondary to #1. Improved after being rx medically with kayexalate  3. 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 Metabolic acidosis- may need bicarb. Cont to follow 5. Abnormal LFT's - likely due to acute illness. Improving  6. ABLA- follow  H/H 7. Dispo- possible CIR tx when stable 8.   Alma Muegge A

## 2013-07-12 NOTE — Discharge Instructions (Signed)
1. Please schedule a follow up appointment with primary care doctor on discharge from SNF. List of primary care providers listed below.  Please follow up w/ vascular surgery as follows:  Mal Misty  In 4 weeks  Office will call you to arrange your appt [place] [address] [phone]  2. Please take all medications as prescribed. Please take Metoprolol 25 mg twice daily + Norvasc 5 mg twice daily for high blood pressure.  3. If you have worsening of your symptoms or new symptoms arise, please call PCP, or go to the ER immediately if symptoms are severe.     Resource Guide 1) Find a Tax adviser and Pay Out of Pocket Although you won't have to find out who is covered by your insurance plan, it is a good idea to ask around and get recommendations. You will then need to call the office and see if the doctor you have chosen will accept you as a new patient and what types of options they offer for patients who are self-pay. Some doctors offer discounts or will set up payment plans for their patients who do not have insurance, but you will need to ask so you aren't surprised when you get to your appointment.  2) Contact Your Local Health Department Not all health departments have doctors that can see patients for sick visits, but many do, so it is worth a call to see if yours does. If you don't know where your local health department is, you can check in your phone book. The CDC also has a tool to help you locate your state's health department, and many state websites also have listings of all of their local health departments.  3) Find a [facility] If your illness is not likely to be very severe or complicated, you may want to try a walk in clinic. These are popping up all over the country in pharmacies, drugstores, and shopping centers. They're usually staffed by nurse practitioners or physician assistants that have been trained to treat common illnesses and complaints.  They're usually fairly quick and inexpensive. However, if you have serious medical issues or chronic medical problems, these are probably not your best option.  No Primary Care Doctor: - Call Health Connect at  [phone] - they can help you locate a primary care doctor that  accepts your insurance, provides certain services, etc. - Physician Referral Service- [phone]  Chronic Pain Problems: Organization         Address  Phone   Notes  [facility]  [phone] Patients need to be referred by their primary care doctor.   Medication Assistance: Organization         Address  Phone   Notes  [facility] Medication [facility] [address] [place], [place] [address] [phone] --Must be a resident of [facility] -- Must have NO insurance coverage whatsoever (no Medicaid/ Medicare, etc.) -- The pt. MUST have a primary care doctor that directs their care regularly and follows them in the community   [facility]  [phone]   [employer]  [phone]    Agencies that provide inexpensive medical care: Organization         Address  Phone   Notes  [place]  [phone]   [facility]    [phone]   [facility] [address], [place] [address] [phone]  [facility] [address] [place] [phone]   [facility]    [phone]   [facility]    [phone]   [place] and [place] [address], [place] Phone:  [phone], Fax:  [phone] Hours of Operation:  9 am - 6 pm, M-F.  Also accepts Medicaid/Medicare and self-pay.  [facility] for [place] [address], [address], [place] Phone: [phone], Fax: [phone]. Hours of Operation:  8:30 am - 5:30 pm, M-F.  Also accepts Medicaid and self-pay.  [facility] [address] [place]  Phone: [phone]   [place], [place], [place] [phone], Ext. [phone] Mondays & Thursdays: 7-9 AM.  First 15 patients are seen on a first come, first serve basis.    [place] Providers:  Organization         Address  Phone   Notes  [facility] [address], [place] [phone] Also accepts self-pay patients.  [facility] [address], [place]  [phone]   [place], [address], [place] [phone]   [facility] [address], [place] [phone]   Lucianne Lei [address], [place]   [phone] Only accepts [place] patients after they have their name applied to their card.   Self-Pay (no insurance) in [facility]:  Organization         Address  Phone   Notes  Sickle Cell Patients, [facility] [place] [phone]   [facility] [place] [phone]   [facility] [place]  [place], [place], [place] [phone]   [facility]/Dr. Osei-Bonsu  [address] [place] or [address], [place] [phone] Phone number for both [place] and [place] locations is the same.  [facility] [address] [place] [phone]   [facility] [address], [place] or [address] [phone]   [facility] [address], [place] [phone], phone; [phone], fax Sees patients 1st and 3rd Saturday of every month.  Must not qualify for public or private insurance (i.e. Medicaid, Medicare, [place] Health Choice, Veterans' Benefits)  Household income should be no more than 200% of the poverty level The clinic cannot  treat you if you are pregnant or think you are pregnant  Sexually transmitted diseases are not treated at the clinic.     Gangrene Gangrene is the decay or death of an organ or tissue caused by loss of blood supply. It may be caused by infectious or inflammatory processes. Or it may be caused by degenerative changes in long-standing diseases such as diabetes. It may also occur following injuries or surgical procedures. TYPES OF GANGRENE There are three major types of gangrene: dry, moist, and gaseous (a type of moist gangrene).  Dry gangrene is a condition that results when one or more arteries become blocked. Arteries are muscular vessels which carry blood away from the heart and around the body. In this type of gangrene, the tissue slowly dies, due to loss of blood supply. But it does not  become infected. The affected area becomes cold and black. It begins to dry out and wither. Eventually it drops off over a period of weeks or months. Dry gangrene is most common in people with advanced blockages of the arteries (arteriosclerosis or hardening of the arteries) resulting from diabetes.  Moist gangrene occurs in the toes, feet, or legs after a crushing injury. Or it can be a result of some other problem that causes blood flow in an area to suddenly stop. When blood flow stops, bacteria begin to invade the muscle and grow. It multiplies quickly, without the body's immune system stopping it.  Gaseous gangrene is also called muscle death (myonecrosis). It is a type of moist gangrene commonly caused by a bacterial infection. This is usually caused by a bacteria which can live in an area of little or no oxygen. Once present in tissue, these bacteria produce gases and poisonous toxins as they grow. These bacteria are normally found in the gut, respiratory, and male urinary tract. They often infect thigh amputation wounds commonly in people who have lost control of their bowel functions (incontinence).  Gangrene, incontinence, and weakness often are found in patients with diabetes. It is in the amputation stump of diabetic patients that gaseous gangrene often happens. The most common bacteria to cause gaseous gangrene is clostridia. Other bacteria which cause moist gangrene include streptococcus and staphylococcus. A serious, but rare form of infection with group A streptococci can slow blood flow. If untreated, it can progress to gangrene. This is more commonly called necrotizing fasciitis. This is an infection of the skin and tissues directly beneath the skin. This is sometimes called the "flesh-eating bacteria". It is called this because it can rapidly kill large areas of flesh in the body. It requires immediate surgical treatment or it will result in death. CAUSES   Some illnesses can cause gangrene because the vessels carrying the blood are already diseased. Examples are:  Long-standing illnesses, such as diabetes mellitus or arteriosclerosis.  Other diseases affecting the blood vessels, such as Buerger disease or Raynaud disease. Other causes of gangrene include:   Open bone breaks.  Burns.  Injections given under the skin or in a muscle.  Gangrene may occur following surgery, particularly in individuals with diabetes or other long-term (chronic) disease.  Gas gangrene can be also a complication of dry gangrene. Or it can happen suddenly along with an underlying cancer. Moist gangrene and gas gangrene are different. Gas gangrene usually involves only the muscle. It does not involve the skin as much. In moist or gas gangrene, there is a feeling of heaviness in the affected area that is followed by severe pain. The pain is caused by swelling from fluid or gas accumulation in the tissues. This pain is the worst, on average, between 1 and 4 days following the injury. It can range from several hours to several weeks. The swollen skin may at first be blistered, red, and warm to the touch. Then it  changes to a bronze, brown, or black color. In the majority of cases, the affected and surrounding tissues may give off crackling sounds (crepitus). This is a result of gas bubbles accumulating under the skin. The gas may be felt beneath the skin. In wet gangrene, the pus is foul-smelling. In gas gangrene, there is no true pus, just an almost "sweet" smelling watery discharge. If the bacterial toxins spread to the bloodstream, the following may happen:  A higher than normal temperature.  Fast heart rate and breathing.  Changed mental state.  Loss of appetite.  Diarrhea.  Vomiting.  Shock. SYMPTOMS  Areas of either dry or moist gangrene are first noticed as a red line on the skin that marks the border of the affected tissues.  As tissues begin to die, dry gangrene may cause pain in the early stages. Or it may go unnoticed.  This happens especially in the elderly or in individuals with decreased ability to feel pain.  At first, the area becomes cold, numb, and pale. Later its color changes to brown, then black. This dead tissue will gradually separate from the healthy tissue and fall off. DIAGNOSIS  Diagnosis of gangrene is based on patient history, physical examination, and results of blood and other lab tests.  A caregiver will look for a history of:  Recent damage caused by an accident (trauma).  Surgery.  Cancer.  Chronic disease.  Blood tests can be used to find out if infection is present and find how much it has spread.  Drainage from a wound, or a tissue sample obtained through surgical exploration, may be cultured to:  Identify the bacteria causing the infection.  Help in figuring out which antibiotic will be most effective.  Gas accumulation and muscle death (myonecrosis) may not be visible. So X-ray studies and more sophisticated imaging techniques may be helpful in making a diagnosis. They include:  Computed tomography (CT) scans.  Magnetic resonance  imaging (MRI).  These techniques are not sufficient alone to provide an accurate diagnosis of gangrene, though.  Precise diagnosis of gas gangrene often requires surgical exploration of the wound.  During such a procedure, the exposed muscle may appear pale, beefy-red. In the most advanced stages, it may be black. If infected, the muscle will fail to contract with stimulation, and the cut surface will not bleed. TREATMENT  Gaseous gangrene is a medical emergency. It requires immediate surgery and antibiotics. If the infection spreads to the bloodstream and infects vital organs, it can rapidly result in death.   Areas of dry gangrene that remain free from infection (aseptic) in the arms or hands and legs or feet (extremities) are often left to wither and fall off. Treatments applied to the wound on the surface are generally not effective without enough blood supply to support wound healing.  Evaluation by a vascular surgeon, along with X-rays to determine blood supply and circulation to the affected area, can help figure out if surgery would be helpful.  Once the cause of infection is found, moist gangrene requires immediate intravenous, intramuscular, or topical broad-spectrum antibiotic therapy. Also, the infected tissue must be removed surgically. Amputation of the affected limb may be needed. Pain medicines (analgesics) are prescribed for pain. Fluids injected into the veins (intravenous fluids) and, occasionally, blood transfusions are used to treat shock and replenish red blood cells and electrolytes. Plenty of water and healthy foods are needed for wound healing.  Some cases of gangrene are treated by giving oxygen under pressure greater than that of the atmosphere (hyperbaric) to the patient. This happens in a specially designed chamber. The idea behind using hyperbaric oxygen is that more oxygen will become dissolved in the patient's bloodstream. So more oxygen will be delivered to the  gangrenous areas. By providing the right amount of oxygenation, the body's ability to fight off the bacterial infection is believed to be improved. There is a direct harmful effect on the bacteria that thrive in an oxygen-free environment. Some studies have shown that the use of hyperbaric oxygen relieves  pain, reduces the number of amputations required, and reduces the extent of surgical debridement (removal of dead or damaged tissue).  The emotional needs of the patient must also be met. The individual with gangrene should be offered moral support, along with an opportunity to share questions and concerns. In addition, particularly in cases where amputation was required; physical, vocational, and rehabilitation therapy will also be required. Except in cases where the infection has spread to the bloodstream, the result of treated gangrene is usually favorable.  PREVENTION  Patients with diabetes or severe arteriosclerosis should take special care of their hands and feet. This is due to the risk of infection associated with even minor injuries. Education about good foot care is important. Poor blood flow as a result diseased blood vessels will lessen the body's defenses against invading germs. Your caregiver will take steps to improve circulation whenever possible. All injuries to skin or tissue should be cared for immediately. Dying or infected skin should be removed immediately. This will prevent the spread of bacteria. Wounds into the abdomen should be surgically treated and damage repaired early. This should be followed up with antibiotic treatment. Patients undergoing non-emergency (elective) intestinal surgery may receive preventive antibiotic therapy. Use of antibiotics prior to and directly following surgery has been shown to significantly reduce the rate of infection.  SEEK IMMEDIATE MEDICAL CARE IF:  You show signs of gaseous gangrene. MAKE SURE YOU:   Understand these instructions.  Will watch  your condition.  Will get help right away if you are not doing well or get worse. Document Released: 03/12/2004 Document Revised: 11/10/2011 Document Reviewed: 05/11/2005 [facility] Patient Information 2014 [place].

## 2013-07-12 NOTE — Discharge Summary (Signed)
Name: Seth West MRN: [id number] DOB: 11/21/1940 73 y.o. PCP: Pcp Not In System  Date of Admission: 07/05/2013 12:09 PM Date of Discharge: 07/12/2013 Attending Physician: Karren Cobble, MD  Discharge Diagnosis: 1. Gangrene  2. Acute Kidney Injury 3. Peripheral Vascular Disease 4. Hypertension 5. Transaminitis  Discharge Medications:   Medication List         amLODipine 5 MG tablet  Commonly known as:  NORVASC  Take 1 tablet (5 mg total) by mouth 2 (two) times daily.     metoprolol tartrate 25 MG tablet  Commonly known as:  LOPRESSOR  Take 1 tablet (25 mg total) by mouth 2 (two) times daily.     oxyCODONE 5 MG immediate release tablet  Commonly known as:  ROXICODONE  Take 1 tablet (5 mg total) by mouth every 4 (four) hours as needed for severe pain.       Disposition and follow-up:   Mr.Seth West was discharged from [facility] in good condition.  At the hospital follow up visit please address:  1.  Urine output. Is patient having pain in the RLE? Phantom pain? Stump clean, dry and intact? Has patient been doing PT? Interest in prosthesis? Medication compliance as an outpatient?  May need further adjustments with BP medications.  2.  Labs / imaging needed at time of follow-up: BMP  3.  Pending labs/ test needing follow-up: none  Follow-up Appointments: Follow-up Information   Follow up with Tinnie Gens, MD In 4 weeks. (Office will call you to arrange your appt (sent))    Specialty:  Vascular Surgery   Contact information:   [address] [place] [address] [phone]      Discharge Instructions: Discharge Orders   Future Appointments Provider Department Dept Phone   08/08/2013 8:30 AM Mal Misty, MD [facility] [phone]   Future Orders Complete By Expires   Call MD for:  redness, tenderness, or signs of infection (pain, swelling, redness, odor or green/yellow discharge around  incision site)  As directed    Call MD for:  severe uncontrolled pain  As directed    Call MD for:  temperature >100.4  As directed      Consultations: Treatment Team:  Mal Misty, MD Sol Blazing, MD  Procedures Performed:  US Renal  07/10/2013   CLINICAL DATA:  Acute renal failure  EXAM: RENAL/URINARY TRACT ULTRASOUND COMPLETE  COMPARISON:  CT ABD W/CM dated 08/02/2006  FINDINGS: Right Kidney:  Length: 11.4 cm. No mass or hydronephrosis. Mildly increased echogenicity.  Left Kidney:  Length: 12.2 cm. No mass or hydronephrosis. Mildly increased echogenicity.  Bladder:  Not evaluated well as it is decompressed by Foley catheter. There may be some degree of bladder wall thickening.  There appears to be sludge within the gallbladder.  IMPRESSION: 1. Medical renal disease 2. Bladder not evaluated well as it is decompressed by Foley catheter. Possible mild bladder wall thickening. 3. Gallbladder is only seen incidentally, but there appears to be sludge within it.   Electronically Signed   By: Skipper Cliche M.D.   On: 07/10/2013 10:01   Dg Foot Complete Right  07/05/2013   CLINICAL DATA:  Wound infection.  EXAM: RIGHT FOOT COMPLETE - 3+ VIEW  COMPARISON:  None.  FINDINGS: There is no fracture or dislocation or bone destruction. Minimal degenerative changes are present at the first metatarsal phalangeal joint. Osseous structures are otherwise normal. Small vessel calcification is noted in the foot.  Minimal osteophyte formation at the anterior aspect of the distal tibia.  IMPRESSION: No acute abnormality. Specifically, no findings suggestive of osteomyelitis.   Electronically Signed   By: Rozetta Nunnery M.D.   On: 07/05/2013 14:02   Admission HPI:  [name] is a 73 y.o. male w/ PMHx of HTN (not on any medications), presents to the ED w/ complaints of a worsening right foot wound. Patient claims that he was in his kitchen 2-3 weeks ago and bumped his foot on a dresser drawer, sustaining a  minor wound to the dorsum of his foot at that time. Since then, he claims the wound did not heal and continued to spread across his foot. He progressively noticed a change in color, worsening pain, and decreased sensation. Patient denies any history of CVA, recent change in vision, and denies a h/o DM or CAD. The patient also claims he has never had a non-healing wound like this before. The patient has a h/o smoking, but claims he quit 2 years ago when his friend passed away from lung cancer.  The patient otherwise denies recent fever, chills, nausea, vomiting, diarrhea, abdominal pain, SOB, chest pain, dizziness, lightheadedness, or LOC.  Hospital Course by problem list:   1. Gangrene w/ RLE BKA- Patient w/ large wound on the dorsum of the foot, w/ surrounding necrotic tissue. Foot cool to the touch w/ significant color changes, 3rd-5th toes black in color. No distal pulses present in RLE below popliteal pulse. +2 pitting edema. Mild pain present on exam. Patient w/ leukocytosis of 15.9, lactic acid of 1.68. tachycardic from 100-120 on exam, fulfilling SIRS criteria. Started on Vancomycin and Zosyn in the ED, continued until 07/08/13 and 07/09/13, respectively (discussed below). LE doppler performed in the ED, no evidence of DVT, ABI 0.53 in the RLE, 0.71 in the LLE, significant for moderate to severe PAD. Vascular surgery consulted in ED, BKA performed on 3/38/25, w/out complications. Wound continued to appear clean and dry with no signs of infection. Leukocytosis resolved completely by 07/09/13.   2. Acute Kidney Injury- Patient w/ no previous CKD, was found to have elevation of Cr from 1.13 to 2.58 on 07/08/13. Continued trend as follows:  Recent Labs Lab 07/09/13 0620 07/10/13 0713 07/10/13 1837 07/11/13 0355 07/12/13 0526  CREATININE 4.09* 4.76* 4.45* 4.41* 4.26*  No contrast administered during surgery. Etiology of acute kidney injury likely 2/2 antibiotic insult vs ischemic ATN. FeNa was  calculated as 3.8%, significant of intrinsic renal injury (ATN). Renal was consulted, ABx were stopped as no further leukocytosis or signs of infections were apparent. Given gentle IVF, bladder scan showed no urinary retention. Inserted foley catheter, renal US performed, showed no signs of hydronephrosis. Assessed for urine eosinophils, however, none were present, making AIN 2/2 Abx less likely. Cr continued to improve as did UO.   3. Peripheral Vascular Disease- Patient w/ h/o HTN, but denies DM, CAD, or previous CVA. Admits to a h/o smoking but claims that he quit 2 years ago after his friend dies from lung cancer. Not on medications for HTN prior to admission, BP 150-160's/80-90's initially.Marland Kitchen ABI in ED significant for moderate to severe peripheral artery disease in both extremities, R>L, 0.53 and 0.71, respectively. Doppler negative for DVT. Vision grossly intact. Cr within normal limits on admission. HbA1c wnl, lipid profile also very respectable. Patient received RLE BKA as described above on 07/05/13.  4. Hypertension- Patient w/ h/o HTN, not on any medications at home previously. BP 150-160's/80-90's in ED. Tachycardia on  exam, 100-120, initially. Started on Metoprolol 25 mg bid, however, BP still found to be elevated. Added Norvasc 5 mg bid, SBO controlled in 110-150 range. May need further adjustments on an outpatient basis.  5. Transaminitis- Patient w/ no previous h/o liver disease, denies a recent h/o alcohol abuse. In ED, found to have AST of 80, ALT of 97, Alk phos of 232. Patient denies any abdominal pain, nausea, or vomiting. Not jaundiced on exam, no RUQ tenderness. Increased LFT's likely 2/2 acute illness. Repeat CMP the next day showed improved LFT's. Acute Hepatitis panel and HIV negative, EtOh and UDS negative on admission. Lipid panel also wnl.  Discharge Vitals:   BP 111/66  Pulse 73  Temp(Src) 98 F (36.7 C) (Axillary)  Resp 19  Ht $R'5\' 9"'SK$  (1.753 m)  Wt 148 lb 2.4 oz (67.2 kg)   BMI 21.87 kg/m2  SpO2 100%  Discharge Labs:  Results for orders placed during the hospital encounter of 07/05/13 (from the past 24 hour(s))  RENAL FUNCTION PANEL     Status: Abnormal   Collection Time    07/12/13  5:26 AM      Result Value Ref Range   Sodium 138  137 - 147 mEq/L   Potassium 4.4  3.7 - 5.3 mEq/L   Chloride 102  96 - 112 mEq/L   CO2 22  19 - 32 mEq/L   Glucose, Bld 102 (*) 70 - 99 mg/dL   BUN 54 (*) 6 - 23 mg/dL   Creatinine, Ser 4.26 (*) 0.50 - 1.35 mg/dL   Calcium 9.8  8.4 - 10.5 mg/dL   Phosphorus 5.0 (*) 2.3 - 4.6 mg/dL   Albumin 2.2 (*) 3.5 - 5.2 g/dL   GFR calc non Af Amer 12 (*) >90 mL/min   GFR calc Af Amer 14 (*) >90 mL/min    Signed: Corky Sox, MD 07/12/2013, 2:17 PM   Time Spent on Discharge: 45 minutes Services Ordered on Discharge: none Equipment Ordered on Discharge: none

## 2013-07-12 NOTE — Progress Notes (Addendum)
[facility] [place]  Subjective  - Doing well.  Pain well controlled.   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 No erythema or active drainage.   Assessment/Planning: POD #3 Right BKA  CIR verses SNF pending INSURANCE F/u with Dr. Hart RochesterLawson 4 weeks from surgery for wound ck. And staple removal.    Seth West, EMMA MAUREEN 07/12/2013 8:25 AM --  Laboratory Lab Results:  Recent Labs  07/10/13 0713  WBC 9.7  HGB 11.2*  HCT 31.8*  PLT 305   BMET  Recent Labs  07/11/13 0355 07/12/13 0526  NA 140 138  K 4.6 4.4  CL 104 102  CO2 22 22  GLUCOSE 90 102*  BUN 53* 54*  CREATININE 4.41* 4.26*  CALCIUM 9.4 9.8    COAG Lab Results  Component Value Date   INR 1.13 07/07/2013   No results found for this basename: PTT   Agree with above assessment Right BKA healing satisfactorily

## 2013-08-07 ENCOUNTER — Encounter: Payer: Self-pay | Admitting: Vascular Surgery

## 2013-08-08 ENCOUNTER — Ambulatory Visit (INDEPENDENT_AMBULATORY_CARE_PROVIDER_SITE_OTHER): Payer: Self-pay | Admitting: Vascular Surgery

## 2013-08-08 ENCOUNTER — Encounter: Payer: Self-pay | Admitting: Vascular Surgery

## 2013-08-08 VITALS — BP 119/81 | HR 66 | Temp 97.2°F | Ht 69.0 in | Wt 148.0 lb

## 2013-08-08 DIAGNOSIS — I70269 Atherosclerosis of native arteries of extremities with gangrene, unspecified extremity: Secondary | ICD-10-CM

## 2013-08-08 NOTE — Progress Notes (Signed)
Subjective:     Patient ID: Seth West, male   DOB: [birth year], 73 y.o.   MRN: [id number]  HPIthis 73 year old male returns 4 weeks post right below knee and dictation for initial followup. [facility]. The skin staples were removed at the facility 2 days ago and Steri-Strips were applied to the lateral aspect of the wound. He has had no chills and fever urine   Review of Systems     Objective:   Physical Exam BP 119/81  Pulse 66  Temp(Src) 97.2 F (36.2 C) (Oral)  Ht 5\' 9"  (1.753 m)  Wt 148 lb (67.132 kg)  BMI 21.85 kg/m2  SpO2 100%  Gen. elderly male no apparent stress alert and oriented x3 and wheelchair Right BKA examined. Slight flexion contracture and knee. Lateral 6-8 cm of wound has separation with some poorly healing tissues beneath this area and also an eschar on the anterior flap in the midportion measuring about 2 x 1 cm with no active infection.     Assessment:     Poorly healing right below-knee amputation stump with slight separation lateral aspect of wound Steri-Strips were removed and reapplied. No active infection    Plan:     Continue daily wound care with dressing changes and nursing facility and return in one week for further examination Discussed with daughter and patient the fact that right AKA may be necessary because of this poor healing but too early to tell at this point Will notify us if chills and fever occur

## 2013-08-14 ENCOUNTER — Encounter: Payer: Self-pay | Admitting: Vascular Surgery

## 2013-08-15 ENCOUNTER — Encounter: Payer: Self-pay | Admitting: Vascular Surgery

## 2013-08-15 ENCOUNTER — Ambulatory Visit (INDEPENDENT_AMBULATORY_CARE_PROVIDER_SITE_OTHER): Payer: Self-pay | Admitting: Vascular Surgery

## 2013-08-15 VITALS — BP 158/84 | HR 69 | Resp 16 | Ht 69.0 in | Wt 159.0 lb

## 2013-08-15 DIAGNOSIS — I70269 Atherosclerosis of native arteries of extremities with gangrene, unspecified extremity: Secondary | ICD-10-CM

## 2013-08-15 NOTE — Progress Notes (Signed)
Subjective:     Patient ID: Seth West, male   DOB: 1940-11-04, 73 y.o.   MRN: [id number]  HPI this 73 year old male is now 4 weeks post right below knee amputation for gangrene right foot. He was seen by me last week and had some wound separation with slow healing laterally and a triangular-shaped eschar in the midportion of the wound. He's had no chills and fever and has been having local wound care.   Review of Systems     Objective:   Physical Exam BP 158/84  Pulse 69  Resp 16  Ht 5\' 9"  (1.753 m)  Wt 159 lb (72.122 kg)  BMI 23.47 kg/m2  General well-developed well-nourished male no apparent stress alert and oriented x3 in a wheelchair Right BKA he examined. The lateral aspect of the wound looks improved today. There is no pertinent drainage. There is about 4 or 5 mm of wound separation laterally and a 1.5 x 1 cm triangular eschar in the midportion of the wound. There is no purulent drainage or cellulitis. It is nontender.     Assessment:     Slowly healing right below knee amputation    Plan:     Continue daily wound care with cleansing and wrapped with Kerlex for protection Return to see me in 3 weeks

## 2013-09-04 ENCOUNTER — Encounter: Payer: Self-pay | Admitting: Vascular Surgery

## 2013-09-05 ENCOUNTER — Encounter (HOSPITAL_COMMUNITY): Payer: Self-pay | Admitting: *Deleted

## 2013-09-05 ENCOUNTER — Ambulatory Visit (INDEPENDENT_AMBULATORY_CARE_PROVIDER_SITE_OTHER): Payer: Self-pay | Admitting: Vascular Surgery

## 2013-09-05 ENCOUNTER — Encounter: Payer: Self-pay | Admitting: Vascular Surgery

## 2013-09-05 ENCOUNTER — Encounter (HOSPITAL_COMMUNITY): Payer: Self-pay

## 2013-09-05 ENCOUNTER — Other Ambulatory Visit: Payer: Self-pay

## 2013-09-05 VITALS — BP 116/57 | HR 73 | Temp 98.2°F | Ht 69.0 in | Wt 159.0 lb

## 2013-09-05 DIAGNOSIS — I70269 Atherosclerosis of native arteries of extremities with gangrene, unspecified extremity: Secondary | ICD-10-CM

## 2013-09-05 MED ORDER — DEXTROSE 5 % IV SOLN
1.5000 g | INTRAVENOUS | Status: AC
  Administered 2013-09-06: 1.5 g via INTRAVENOUS
  Filled 2013-09-05: qty 1.5

## 2013-09-05 MED ORDER — SODIUM CHLORIDE 0.9 % IV SOLN: INTRAVENOUS | Status: DC

## 2013-09-05 MED ORDER — CHLORHEXIDINE GLUCONATE CLOTH 2 % EX PADS: 6.0000 | MEDICATED_PAD | Freq: Once | CUTANEOUS | Status: DC

## 2013-09-05 NOTE — Progress Notes (Signed)
Subjective:     Patient ID: Seth West, male   DOB: May 20, 1941, 73 y.o.   MRN: [id number]  HPI this 73 year old male had a right BKA performed by me on 07/05/2013 4 severe gangrene of the right foot. The lateral aspect of the amputation stump has never healed properly. He returns today for further examination. He continues to have some pain and discomfort in this area but no chills or fever.  Past Medical History  Diagnosis Date  . Hypertension     has been off meds for years.   . Peripheral vascular disease   . Gangrene of foot 06/2013    right/notes 07/05/2013    History  Substance Use Topics  . Smoking status: Former Smoker -- 3.00 packs/day for 50 years    Types: Cigarettes    Quit date: 05/11/2013  . Smokeless tobacco: Never Used     Comment: 07/05/2013 "quit smoking cigarettes the week before Christmas"  . Alcohol Use: Yes     Comment: 07/05/2013 "drinks 1 quart/day"/notes 4/05/27/2009; denies alcohol hx on 07/05/2013    History reviewed. No pertinent family history.  No Known Allergies  Current outpatient prescriptions:amLODipine (NORVASC) 5 MG tablet, Take 1 tablet (5 mg total) by mouth 2 (two) times daily., Disp: 60 tablet, Rfl: 2;  amoxicillin-clavulanate (AUGMENTIN) 875-125 MG per tablet, Take 1 tablet by mouth 2 (two) times daily., Disp: , Rfl: ;  doxycycline (DORYX) 100 MG DR capsule, Take 100 mg by mouth 2 (two) times daily., Disp: , Rfl:  metoprolol tartrate (LOPRESSOR) 25 MG tablet, Take 1 tablet (25 mg total) by mouth 2 (two) times daily., Disp: 60 tablet, Rfl: 2;  oxyCODONE (ROXICODONE) 5 MG immediate release tablet, Take 1 tablet (5 mg total) by mouth every 4 (four) hours as needed for severe pain., Disp: 30 tablet, Rfl: 0  BP 116/57  Pulse 73  Temp(Src) 98.2 F (36.8 C) (Oral)  Ht 5\' 9"  (1.753 m)  Wt 159 lb (72.122 kg)  BMI 23.47 kg/m2  SpO2 100%  Body mass index is 23.47 kg/(m^2).           Review of Systems denies active chest pain, dyspnea on  exertion, PND, orthopnea, hemoptysis, lateralizing weakness, aphasia, amaurosis fugax, diplopia, blurred vision, syncope     Objective:   Physical Exam BP 116/57  Pulse 73  Temp(Src) 98.2 F (36.8 C) (Oral)  Ht 5\' 9"  (1.753 m)  Wt 159 lb (72.122 kg)  BMI 23.47 kg/m2  SpO2 100%  Gen.-alert and oriented x3 in no apparent distress HEENT normal for age Lungs no rhonchi or wheezing Cardiovascular regular rhythm no murmurs carotid pulses 3+ palpable no bruits audible Abdomen soft nontender no palpable masses Musculoskeletal free of  major deformities Skin clear -no rashes Neurologic normal Lower extremities 3+ femoral pulses bilaterally. Right BKA has lateral half of the wound with skin necrosis and fat necrosis beneath this. Skin quality posteriorly is poor. No cellulitis noted.       Assessment:     Nonhealing right below-knee dictation with break down laterally and poor skin quality posteriorly-needs AKA    Plan:     Plan right AKA in AM  patient is agreeable

## 2013-09-05 NOTE — Progress Notes (Signed)
Pt nurse, Joni ReiningNicole completed SDW assessment. Apnea screening incomplet, please f/u on DOS. Nurse denies pt having an EKG or chest x ray. MAR to be faxed and sent on DOS.

## 2013-09-06 ENCOUNTER — Encounter (HOSPITAL_COMMUNITY): Payer: Self-pay | Admitting: *Deleted

## 2013-09-06 ENCOUNTER — Inpatient Hospital Stay (HOSPITAL_COMMUNITY): Payer: Medicare Other | Admitting: Certified Registered Nurse Anesthetist

## 2013-09-06 ENCOUNTER — Encounter (HOSPITAL_COMMUNITY): Payer: Medicare Other | Admitting: Certified Registered Nurse Anesthetist

## 2013-09-06 ENCOUNTER — Inpatient Hospital Stay (HOSPITAL_COMMUNITY): Payer: Medicare Other

## 2013-09-06 ENCOUNTER — Inpatient Hospital Stay (HOSPITAL_COMMUNITY)
Admission: RE | Admit: 2013-09-06 | Discharge: 2013-09-08 | DRG: 476 | Disposition: A | Discharge status: Skilled Nursing Facility | Payer: Medicare Other | Source: Ambulatory Visit | Attending: Vascular Surgery | Admitting: Vascular Surgery

## 2013-09-06 ENCOUNTER — Encounter (HOSPITAL_COMMUNITY)
Admission: RE | Disposition: A | Discharge status: Skilled Nursing Facility | Payer: Self-pay | Source: Ambulatory Visit | Attending: Vascular Surgery

## 2013-09-06 DIAGNOSIS — IMO0002 Reserved for concepts with insufficient information to code with codable children: Secondary | ICD-10-CM

## 2013-09-06 DIAGNOSIS — Y835 Amputation of limb(s) as the cause of abnormal reaction of the patient, or of later complication, without mention of misadventure at the time of the procedure: Secondary | ICD-10-CM | POA: Diagnosis present

## 2013-09-06 DIAGNOSIS — I739 Peripheral vascular disease, unspecified: Secondary | ICD-10-CM | POA: Diagnosis present

## 2013-09-06 DIAGNOSIS — I10 Essential (primary) hypertension: Secondary | ICD-10-CM | POA: Diagnosis present

## 2013-09-06 DIAGNOSIS — T8789 Other complications of amputation stump: Secondary | ICD-10-CM

## 2013-09-06 DIAGNOSIS — S88119A Complete traumatic amputation at level between knee and ankle, unspecified lower leg, initial encounter: Secondary | ICD-10-CM

## 2013-09-06 DIAGNOSIS — Z87891 Personal history of nicotine dependence: Secondary | ICD-10-CM

## 2013-09-06 HISTORY — PX: AMPUTATION: SHX166

## 2013-09-06 HISTORY — DX: Unspecified dementia, unspecified severity, without behavioral disturbance, psychotic disturbance, mood disturbance, and anxiety: F03.90

## 2013-09-06 LAB — BASIC METABOLIC PANEL
BUN: 20 mg/dL (ref 6–23)
CO2: 24 meq/L (ref 19–32)
Calcium: 10 mg/dL (ref 8.4–10.5)
Chloride: 101 mEq/L (ref 96–112)
Creatinine, Ser: 0.94 mg/dL (ref 0.50–1.35)
GFR calc Af Amer: >90 mL/min (ref >90)
GFR, EST NON AFRICAN AMERICAN: 80 mL/min — AB (ref >90)
Glucose, Bld: 92 mg/dL (ref 70–99)
POTASSIUM: 4.3 meq/L (ref 3.7–5.3)
SODIUM: 139 meq/L (ref 137–147)

## 2013-09-06 LAB — CBC
HEMATOCRIT: 31.3 % — AB (ref 39.0–52.0)
Hemoglobin: 10.6 g/dL — ABNORMAL LOW (ref 13.0–17.0)
MCH: 29.3 pg (ref 26.0–34.0)
MCHC: 33.9 g/dL (ref 30.0–36.0)
MCV: 86.5 fL (ref 78.0–100.0)
Platelets: 220 10*3/uL (ref 150–400)
RBC: 3.62 MIL/uL — ABNORMAL LOW (ref 4.22–5.81)
RDW: 13.5 % (ref 11.5–15.5)
WBC: 7.6 10*3/uL (ref 4.0–10.5)

## 2013-09-06 SURGERY — AMPUTATION, ABOVE KNEE
Anesthesia: General | Site: Leg Upper | Laterality: Right

## 2013-09-06 MED ORDER — SUCCINYLCHOLINE CHLORIDE 20 MG/ML IJ SOLN
INTRAMUSCULAR | Status: AC
  Filled 2013-09-06: qty 1

## 2013-09-06 MED ORDER — EPHEDRINE SULFATE 50 MG/ML IJ SOLN
INTRAMUSCULAR | Status: DC | PRN
  Administered 2013-09-06: 5 mg via INTRAVENOUS

## 2013-09-06 MED ORDER — MIDAZOLAM HCL 2 MG/2ML IJ SOLN
INTRAMUSCULAR | Status: AC
  Filled 2013-09-06: qty 2

## 2013-09-06 MED ORDER — LIDOCAINE HCL (CARDIAC) 20 MG/ML IV SOLN
INTRAVENOUS | Status: AC
  Filled 2013-09-06: qty 5

## 2013-09-06 MED ORDER — HYDROMORPHONE HCL PF 1 MG/ML IJ SOLN
0.2500 mg | INTRAMUSCULAR | Status: DC | PRN
  Administered 2013-09-06 (×4): 0.5 mg via INTRAVENOUS

## 2013-09-06 MED ORDER — ONDANSETRON HCL 4 MG/2ML IJ SOLN
INTRAMUSCULAR | Status: AC
  Filled 2013-09-06: qty 2

## 2013-09-06 MED ORDER — ARTIFICIAL TEARS OP OINT
TOPICAL_OINTMENT | OPHTHALMIC | Status: AC
  Filled 2013-09-06: qty 3.5

## 2013-09-06 MED ORDER — ONDANSETRON HCL 4 MG/2ML IJ SOLN: 4.0000 mg | Freq: Four times a day (QID) | INTRAMUSCULAR | Status: DC | PRN

## 2013-09-06 MED ORDER — DOCUSATE SODIUM 100 MG PO CAPS
100.0000 mg | ORAL_CAPSULE | Freq: Every day | ORAL | Status: DC
Start: 2013-09-07 — End: 2013-09-08
  Administered 2013-09-08: 100 mg via ORAL
  Filled 2013-09-06 (×2): qty 1

## 2013-09-06 MED ORDER — PANTOPRAZOLE SODIUM 40 MG PO TBEC
40.0000 mg | DELAYED_RELEASE_TABLET | Freq: Every day | ORAL | Status: DC
  Administered 2013-09-06 – 2013-09-08 (×3): 40 mg via ORAL
  Filled 2013-09-06 (×2): qty 1

## 2013-09-06 MED ORDER — ONDANSETRON HCL 4 MG/2ML IJ SOLN
INTRAMUSCULAR | Status: DC | PRN
  Administered 2013-09-06: 4 mg via INTRAVENOUS

## 2013-09-06 MED ORDER — HYDROMORPHONE HCL PF 1 MG/ML IJ SOLN
INTRAMUSCULAR | Status: AC
  Filled 2013-09-06: qty 1

## 2013-09-06 MED ORDER — STERILE WATER FOR INJECTION IJ SOLN
INTRAMUSCULAR | Status: AC
  Filled 2013-09-06: qty 10

## 2013-09-06 MED ORDER — MORPHINE SULFATE 2 MG/ML IJ SOLN
2.0000 mg | INTRAMUSCULAR | Status: DC | PRN
  Administered 2013-09-06 (×3): 4 mg via INTRAVENOUS
  Filled 2013-09-06 (×3): qty 2

## 2013-09-06 MED ORDER — OXYCODONE HCL 5 MG PO TABS
ORAL_TABLET | ORAL | Status: AC
  Administered 2013-09-06: 5 mg
  Filled 2013-09-06: qty 1

## 2013-09-06 MED ORDER — LACTATED RINGERS IV SOLN
INTRAVENOUS | Status: DC | PRN
  Administered 2013-09-06 (×2): via INTRAVENOUS

## 2013-09-06 MED ORDER — GUAIFENESIN-DM 100-10 MG/5ML PO SYRP: 15.0000 mL | ORAL_SOLUTION | ORAL | Status: DC | PRN

## 2013-09-06 MED ORDER — HYDRALAZINE HCL 20 MG/ML IJ SOLN: 10.0000 mg | INTRAMUSCULAR | Status: DC | PRN

## 2013-09-06 MED ORDER — AMLODIPINE BESYLATE 5 MG PO TABS
5.0000 mg | ORAL_TABLET | Freq: Two times a day (BID) | ORAL | Status: DC
  Administered 2013-09-06 – 2013-09-08 (×4): 5 mg via ORAL
  Filled 2013-09-06 (×5): qty 1

## 2013-09-06 MED ORDER — EPHEDRINE SULFATE 50 MG/ML IJ SOLN
INTRAMUSCULAR | Status: AC
  Filled 2013-09-06: qty 1

## 2013-09-06 MED ORDER — LABETALOL HCL 5 MG/ML IV SOLN
10.0000 mg | INTRAVENOUS | Status: DC | PRN
  Filled 2013-09-06: qty 4

## 2013-09-06 MED ORDER — PHENOL 1.4 % MT LIQD
1.0000 | OROMUCOSAL | Status: DC | PRN
  Filled 2013-09-06: qty 177

## 2013-09-06 MED ORDER — OXYCODONE HCL 5 MG PO TABS
5.0000 mg | ORAL_TABLET | ORAL | Status: DC | PRN
  Administered 2013-09-08: 5 mg via ORAL
  Filled 2013-09-06: qty 1

## 2013-09-06 MED ORDER — FENTANYL CITRATE 0.05 MG/ML IJ SOLN
INTRAMUSCULAR | Status: AC
  Filled 2013-09-06: qty 5

## 2013-09-06 MED ORDER — ROCURONIUM BROMIDE 50 MG/5ML IV SOLN
INTRAVENOUS | Status: AC
  Filled 2013-09-06: qty 1

## 2013-09-06 MED ORDER — LIDOCAINE HCL (CARDIAC) 20 MG/ML IV SOLN
INTRAVENOUS | Status: DC | PRN
  Administered 2013-09-06: 50 mg via INTRAVENOUS

## 2013-09-06 MED ORDER — PROPOFOL 10 MG/ML IV BOLUS
INTRAVENOUS | Status: AC
  Filled 2013-09-06: qty 20

## 2013-09-06 MED ORDER — FENTANYL CITRATE 0.05 MG/ML IJ SOLN
INTRAMUSCULAR | Status: DC | PRN
  Administered 2013-09-06 (×2): 25 ug via INTRAVENOUS
  Administered 2013-09-06: 50 ug via INTRAVENOUS
  Administered 2013-09-06 (×5): 25 ug via INTRAVENOUS
  Administered 2013-09-06: 50 ug via INTRAVENOUS

## 2013-09-06 MED ORDER — PROPOFOL 10 MG/ML IV BOLUS
INTRAVENOUS | Status: DC | PRN
  Administered 2013-09-06: 50 mg via INTRAVENOUS
  Administered 2013-09-06: 100 mg via INTRAVENOUS

## 2013-09-06 MED ORDER — ACETAMINOPHEN 650 MG RE SUPP: 325.0000 mg | RECTAL | Status: DC | PRN

## 2013-09-06 MED ORDER — PROMETHAZINE HCL 25 MG/ML IJ SOLN: 6.2500 mg | INTRAMUSCULAR | Status: DC | PRN

## 2013-09-06 MED ORDER — SODIUM CHLORIDE 0.9 % IV SOLN
INTRAVENOUS | Status: DC
  Administered 2013-09-06 – 2013-09-08 (×3): via INTRAVENOUS

## 2013-09-06 MED ORDER — ENOXAPARIN SODIUM 40 MG/0.4ML ~~LOC~~ SOLN
40.0000 mg | SUBCUTANEOUS | Status: DC
  Administered 2013-09-07 – 2013-09-08 (×2): 40 mg via SUBCUTANEOUS
  Filled 2013-09-06 (×2): qty 0.4

## 2013-09-06 MED ORDER — OXYCODONE HCL 5 MG PO TABS: 5.0000 mg | ORAL_TABLET | Freq: Once | ORAL | Status: DC | PRN

## 2013-09-06 MED ORDER — ALUM & MAG HYDROXIDE-SIMETH 200-200-20 MG/5ML PO SUSP: 15.0000 mL | ORAL | Status: DC | PRN

## 2013-09-06 MED ORDER — METOPROLOL TARTRATE 1 MG/ML IV SOLN: 2.0000 mg | INTRAVENOUS | Status: DC | PRN

## 2013-09-06 MED ORDER — POTASSIUM CHLORIDE CRYS ER 20 MEQ PO TBCR: 20.0000 meq | EXTENDED_RELEASE_TABLET | Freq: Every day | ORAL | Status: DC | PRN

## 2013-09-06 MED ORDER — 0.9 % SODIUM CHLORIDE (POUR BTL) OPTIME
TOPICAL | Status: DC | PRN
  Administered 2013-09-06: 1000 mL

## 2013-09-06 MED ORDER — METOPROLOL TARTRATE 25 MG PO TABS
25.0000 mg | ORAL_TABLET | Freq: Two times a day (BID) | ORAL | Status: DC
  Administered 2013-09-06 – 2013-09-08 (×4): 25 mg via ORAL
  Filled 2013-09-06 (×5): qty 1

## 2013-09-06 MED ORDER — ACETAMINOPHEN 325 MG PO TABS: 325.0000 mg | ORAL_TABLET | ORAL | Status: DC | PRN

## 2013-09-06 MED ORDER — OXYCODONE HCL 5 MG/5ML PO SOLN: 5.0000 mg | Freq: Once | ORAL | Status: DC | PRN

## 2013-09-06 MED ORDER — DEXTROSE 5 % IV SOLN
1.5000 g | Freq: Two times a day (BID) | INTRAVENOUS | Status: AC
  Administered 2013-09-06 – 2013-09-07 (×2): 1.5 g via INTRAVENOUS
  Filled 2013-09-06 (×2): qty 1.5

## 2013-09-06 SURGICAL SUPPLY — 53 items
BANDAGE ELASTIC 6 VELCRO ST LF (GAUZE/BANDAGES/DRESSINGS) ×3 IMPLANT
BANDAGE ESMARK 6X9 LF (GAUZE/BANDAGES/DRESSINGS) IMPLANT
BANDAGE GAUZE ELAST BULKY 4 IN (GAUZE/BANDAGES/DRESSINGS) ×6 IMPLANT
BLADE SAW RECIP 87.9 MT (BLADE) ×3 IMPLANT
BNDG CMPR 9X6 STRL LF SNTH (GAUZE/BANDAGES/DRESSINGS)
BNDG COHESIVE 6X5 TAN STRL LF (GAUZE/BANDAGES/DRESSINGS) ×3 IMPLANT
BNDG ESMARK 6X9 LF (GAUZE/BANDAGES/DRESSINGS)
BNDG GAUZE ELAST 4 BULKY (GAUZE/BANDAGES/DRESSINGS) ×2 IMPLANT
CANISTER SUCTION 2500CC (MISCELLANEOUS) ×3 IMPLANT
CLIP TI MEDIUM 6 (CLIP) IMPLANT
COVER SURGICAL LIGHT HANDLE (MISCELLANEOUS) ×3 IMPLANT
CUFF TOURNIQUET SINGLE 24IN (TOURNIQUET CUFF) IMPLANT
CUFF TOURNIQUET SINGLE 34IN LL (TOURNIQUET CUFF) IMPLANT
CUFF TOURNIQUET SINGLE 44IN (TOURNIQUET CUFF) IMPLANT
DRAPE ORTHO SPLIT 77X108 STRL (DRAPES) ×6
DRAPE PROXIMA HALF (DRAPES) IMPLANT
DRAPE SURG ORHT 6 SPLT 77X108 (DRAPES) ×2 IMPLANT
DRESSING OPSITE X SMALL 2X3 (GAUZE/BANDAGES/DRESSINGS) ×2 IMPLANT
DRSG ADAPTIC 3X8 NADH LF (GAUZE/BANDAGES/DRESSINGS) ×3 IMPLANT
DRSG OPSITE 4X5.5 SM (GAUZE/BANDAGES/DRESSINGS) ×2 IMPLANT
DRSG PAD ABDOMINAL 8X10 ST (GAUZE/BANDAGES/DRESSINGS) ×3 IMPLANT
ELECT REM PT RETURN 9FT ADLT (ELECTROSURGICAL) ×3
ELECTRODE REM PT RTRN 9FT ADLT (ELECTROSURGICAL) ×1 IMPLANT
EVACUATOR 1/8 PVC DRAIN (DRAIN) ×3 IMPLANT
GLOVE BIO SURGEON STRL SZ 6.5 (GLOVE) ×3 IMPLANT
GLOVE BIO SURGEONS STRL SZ 6.5 (GLOVE) ×3
GLOVE SS BIOGEL STRL SZ 7 (GLOVE) ×1 IMPLANT
GLOVE SUPERSENSE BIOGEL SZ 7 (GLOVE) ×2
GLOVE SURG SS PI 6.5 STRL IVOR (GLOVE) ×2 IMPLANT
GLOVE SURG SS PI 7.0 STRL IVOR (GLOVE) ×4 IMPLANT
GOWN STRL REUS W/ TWL LRG LVL3 (GOWN DISPOSABLE) ×3 IMPLANT
GOWN STRL REUS W/TWL LRG LVL3 (GOWN DISPOSABLE) ×9
KIT BASIN OR (CUSTOM PROCEDURE TRAY) ×3 IMPLANT
KIT ROOM TURNOVER OR (KITS) ×3 IMPLANT
NS IRRIG 1000ML POUR BTL (IV SOLUTION) ×3 IMPLANT
PACK GENERAL/GYN (CUSTOM PROCEDURE TRAY) ×3 IMPLANT
PAD ARMBOARD 7.5X6 YLW CONV (MISCELLANEOUS) ×6 IMPLANT
PADDING CAST COTTON 6X4 STRL (CAST SUPPLIES) IMPLANT
SPONGE GAUZE 4X4 12PLY (GAUZE/BANDAGES/DRESSINGS) ×6 IMPLANT
STAPLER VISISTAT 35W (STAPLE) ×3 IMPLANT
STOCKINETTE IMPERVIOUS LG (DRAPES) ×3 IMPLANT
SUT SILK 2 0 (SUTURE) ×3
SUT SILK 2 0 SH CR/8 (SUTURE) ×6 IMPLANT
SUT SILK 2-0 18XBRD TIE 12 (SUTURE) ×1 IMPLANT
SUT SILK 3 0 (SUTURE) ×3
SUT SILK 3-0 18XBRD TIE 12 (SUTURE) ×1 IMPLANT
SUT VIC AB 2-0 CT1 18 (SUTURE) ×3 IMPLANT
SUT VIC AB 2-0 CT1 27 (SUTURE) ×6
SUT VIC AB 2-0 CT1 TAPERPNT 27 (SUTURE) ×2 IMPLANT
TOWEL OR 17X24 6PK STRL BLUE (TOWEL DISPOSABLE) ×3 IMPLANT
TOWEL OR 17X26 10 PK STRL BLUE (TOWEL DISPOSABLE) ×3 IMPLANT
UNDERPAD 30X30 INCONTINENT (UNDERPADS AND DIAPERS) ×3 IMPLANT
WATER STERILE IRR 1000ML POUR (IV SOLUTION) ×1 IMPLANT

## 2013-09-06 NOTE — Anesthesia Procedure Notes (Signed)
Procedure Name: LMA Insertion Date/Time: 09/06/2013 8:29 AM Performed by: Margaree MackintoshYACOUB, Citlally Captain B Pre-anesthesia Checklist: Patient identified, Timeout performed, Emergency Drugs available, Suction available and Patient being monitored Patient Re-evaluated:Patient Re-evaluated prior to inductionOxygen Delivery Method: Circle system utilized Preoxygenation: Pre-oxygenation with 100% oxygen Intubation Type: IV induction LMA: LMA inserted LMA Size: 5.0 Number of attempts: 1 Placement Confirmation: positive ETCO2 and breath sounds checked- equal and bilateral Tube secured with: Tape Dental Injury: Teeth and Oropharynx as per pre-operative assessment

## 2013-09-06 NOTE — Op Note (Signed)
OPERATIVE REPORT  Date of Surgery: 09/06/2013  Surgeon: Seth GipJames Takeshi Teasdale, MD  Assistant: [name],PA  Pre-op Diagnosis: nonhealing right below knee amputation  Post-op Diagnosis: nonhealing right below knee amputation  Procedure: Procedure(s): RIGHT ABOVE KNEE AMPUTATION  Anesthesia: General  EBL: 50 cc  Complications: None  The patient was taken to the operating room placed in supine position at which time satisfactory general endotracheal anesthesia was administered. The patient had a right below-knee amputation which had not healed and had ischemic changes on the posterior flap. This was isolated with a sterile dressing and the remainder of the extremity was prepped with Betadine scrub and solution draped in routine sterile manner isolating the distal stump with an impervious stockinette. Skin flaps equal anterior and posterior were marked pacing the femur about 4-5 inches proximal to the knee joint. Incision was carried down through skin subcutaneous tissue with the scalpel. Muscles divided with the Bovie. Femur was cleaned proximally with periosteal elevator divided with a Stryker saw and smoothed with a rasp. Superficial femoral artery nerve and vein were individually ligated with 2-0 silk ties and ligatures x posterior muscles divided with the Bovie specimen removed from the table. Saphenous vein was ligated with 2-0 silk tie. Adequate hemostasis was achieved following thorough irrigation a Hemovac was brought out through a medial stab wound secured with a silk suture and wound closed with interrupted 2-0 Vicryl subcutaneous 2-0 Vicryl and skin clips sterile dressing applied patient taken to the recovery room in stable condition  Procedure Details:   Seth GipJames Annaka Cleaver, MD 09/06/2013 9:43 AM

## 2013-09-06 NOTE — Anesthesia Preprocedure Evaluation (Signed)
Anesthesia Evaluation    Reviewed: Allergy & Precautions, H&P , NPO status , Patient's Chart, lab work & pertinent test results  History of Anesthesia Complications Negative for: history of anesthetic complications  Airway Mallampati: II TM Distance: >3 FB Neck ROM: Full    Dental  (+) Edentulous Upper, Missing, Dental Advisory Given   Pulmonary former smoker,    Pulmonary exam normal       Cardiovascular hypertension, + Peripheral Vascular Disease     Neuro/Psych Dementia negative psych ROS   GI/Hepatic negative GI ROS, Neg liver ROS,   Endo/Other  negative endocrine ROS  Renal/GU      Musculoskeletal   Abdominal   Peds  Hematology negative hematology ROS (+)   Anesthesia Other Findings   Reproductive/Obstetrics                           Anesthesia Physical Anesthesia Plan  ASA: III  Anesthesia Plan: General   Post-op Pain Management:    Induction: Intravenous  Airway Management Planned: LMA  Additional Equipment:   Intra-op Plan:   Post-operative Plan: Extubation in OR  Informed Consent:   Plan Discussed with: CRNA, Anesthesiologist and Surgeon  Anesthesia Plan Comments:         Anesthesia Quick Evaluation

## 2013-09-06 NOTE — Anesthesia Postprocedure Evaluation (Signed)
Anesthesia Post Note  Patient: Seth West  Procedure(s) Performed: Procedure(s) (LRB): RIGHT ABOVE KNEE AMPUTATION (Right)  Anesthesia type: general  Patient location: PACU  Post pain: Pain level controlled  Post assessment: Patient's Cardiovascular Status Stable  Last Vitals:  Filed Vitals:   09/06/13 0945  BP: 131/69  Pulse: 78  Temp: 36.6 C  Resp: 18    Post vital signs: Reviewed and stable  Level of consciousness: sedated  Complications: No apparent anesthesia complications

## 2013-09-06 NOTE — Progress Notes (Signed)
Transported to room by sharon rn

## 2013-09-06 NOTE — Transfer of Care (Signed)
Immediate Anesthesia Transfer of Care Note  Patient: Seth West  Procedure(s) Performed: Procedure(s): RIGHT ABOVE KNEE AMPUTATION (Right)  Patient Location: PACU  Anesthesia Type:General  Level of Consciousness: awake, alert  and oriented  Airway & Oxygen Therapy: Patient Spontanous Breathing and Patient connected to nasal cannula oxygen  Post-op Assessment: Report given to PACU RN and Post -op Vital signs reviewed and stable  Post vital signs: Reviewed and stable  Complications: No apparent anesthesia complications

## 2013-09-06 NOTE — Progress Notes (Signed)
Pt has hx dementia pleasently confusef

## 2013-09-06 NOTE — Progress Notes (Signed)
Utilization review completed.  

## 2013-09-06 NOTE — H&P (View-Only) (Signed)
Subjective:     Patient ID: Seth West, male   DOB: 11/30/1940, 72 y.o.   MRN: [id number]  HPI this 72-year-old male had a right BKA performed by me on 07/05/2013 4 severe gangrene of the right foot. The lateral aspect of the amputation stump has never healed properly. He returns today for further examination. He continues to have some pain and discomfort in this area but no chills or fever.  Past Medical History  Diagnosis Date  . Hypertension     has been off meds for years.   . Peripheral vascular disease   . Gangrene of foot 06/2013    right/notes 07/05/2013    History  Substance Use Topics  . Smoking status: Former Smoker -- 3.00 packs/day for 50 years    Types: Cigarettes    Quit date: 05/11/2013  . Smokeless tobacco: Never Used     Comment: 07/05/2013 "quit smoking cigarettes the week before Christmas"  . Alcohol Use: Yes     Comment: 07/05/2013 "drinks 1 quart/day"/notes 4/05/27/2009; denies alcohol hx on 07/05/2013    History reviewed. No pertinent family history.  No Known Allergies  Current outpatient prescriptions:amLODipine (NORVASC) 5 MG tablet, Take 1 tablet (5 mg total) by mouth 2 (two) times daily., Disp: 60 tablet, Rfl: 2;  amoxicillin-clavulanate (AUGMENTIN) 875-125 MG per tablet, Take 1 tablet by mouth 2 (two) times daily., Disp: , Rfl: ;  doxycycline (DORYX) 100 MG DR capsule, Take 100 mg by mouth 2 (two) times daily., Disp: , Rfl:  metoprolol tartrate (LOPRESSOR) 25 MG tablet, Take 1 tablet (25 mg total) by mouth 2 (two) times daily., Disp: 60 tablet, Rfl: 2;  oxyCODONE (ROXICODONE) 5 MG immediate release tablet, Take 1 tablet (5 mg total) by mouth every 4 (four) hours as needed for severe pain., Disp: 30 tablet, Rfl: 0  BP 116/57  Pulse 73  Temp(Src) 98.2 F (36.8 C) (Oral)  Ht 5' 9" (1.753 m)  Wt 159 lb (72.122 kg)  BMI 23.47 kg/m2  SpO2 100%  Body mass index is 23.47 kg/(m^2).           Review of Systems denies active chest pain, dyspnea on  exertion, PND, orthopnea, hemoptysis, lateralizing weakness, aphasia, amaurosis fugax, diplopia, blurred vision, syncope     Objective:   Physical Exam BP 116/57  Pulse 73  Temp(Src) 98.2 F (36.8 C) (Oral)  Ht 5' 9" (1.753 m)  Wt 159 lb (72.122 kg)  BMI 23.47 kg/m2  SpO2 100%  Gen.-alert and oriented x3 in no apparent distress HEENT normal for age Lungs no rhonchi or wheezing Cardiovascular regular rhythm no murmurs carotid pulses 3+ palpable no bruits audible Abdomen soft nontender no palpable masses Musculoskeletal free of  major deformities Skin clear -no rashes Neurologic normal Lower extremities 3+ femoral pulses bilaterally. Right BKA has lateral half of the wound with skin necrosis and fat necrosis beneath this. Skin quality posteriorly is poor. No cellulitis noted.       Assessment:     Nonhealing right below-knee dictation with break down laterally and poor skin quality posteriorly-needs AKA    Plan:     Plan right AKA in AM  patient is agreeable      

## 2013-09-06 NOTE — Interval H&P Note (Signed)
History and Physical Interval Note:  09/06/2013 8:13 AM  Seth West  has presented today for surgery, with the diagnosis of nonhealing right below knee amputation  The various methods of treatment have been discussed with the patient and family. After consideration of risks, benefits and other options for treatment, the patient has consented to  Procedure(s): AMPUTATION ABOVE KNEE-RIGHT  (Right) as a surgical intervention .  The patient's history has been reviewed, patient examined, no change in status, stable for surgery.  I have reviewed the patient's chart and labs.  Questions were answered to the patient's satisfaction.     Pryor OchoaJames D Lawson

## 2013-09-07 ENCOUNTER — Telehealth: Payer: Self-pay | Admitting: Vascular Surgery

## 2013-09-07 ENCOUNTER — Encounter (HOSPITAL_COMMUNITY): Payer: Self-pay | Admitting: Vascular Surgery

## 2013-09-07 LAB — CBC
HEMATOCRIT: 31.5 % — AB (ref 39.0–52.0)
Hemoglobin: 11.1 g/dL — ABNORMAL LOW (ref 13.0–17.0)
MCH: 29.6 pg (ref 26.0–34.0)
MCHC: 35.2 g/dL (ref 30.0–36.0)
MCV: 84 fL (ref 78.0–100.0)
Platelets: 246 10*3/uL (ref 150–400)
RBC: 3.75 MIL/uL — AB (ref 4.22–5.81)
RDW: 13.2 % (ref 11.5–15.5)
WBC: 11.8 10*3/uL — ABNORMAL HIGH (ref 4.0–10.5)

## 2013-09-07 LAB — BASIC METABOLIC PANEL
BUN: 9 mg/dL (ref 6–23)
CHLORIDE: 94 meq/L — AB (ref 96–112)
CO2: 24 meq/L (ref 19–32)
Calcium: 9.8 mg/dL (ref 8.4–10.5)
Creatinine, Ser: 0.81 mg/dL (ref 0.50–1.35)
GFR calc Af Amer: >90 mL/min (ref >90)
GFR calc non Af Amer: 85 mL/min — ABNORMAL LOW (ref >90)
Glucose, Bld: 124 mg/dL — ABNORMAL HIGH (ref 70–99)
POTASSIUM: 4.4 meq/L (ref 3.7–5.3)
SODIUM: 134 meq/L — AB (ref 137–147)

## 2013-09-07 NOTE — Progress Notes (Signed)
Physical medicine rehabilitation consult requested chart reviewed. Patient with recent right below knee amputation 07/05/2013 was discharged to skilled nursing facility was readmitted for nonhealing wound and underwent right above-knee amputation 09/06/2013. Patient currently resides skilled nursing facility with plan to return to facility. Hold on formal rehabilitation consult at this time with plans skilled nursing facility. Please reconsult if any change in disposition

## 2013-09-07 NOTE — Progress Notes (Signed)
Clinical Social Work Department BRIEF PSYCHOSOCIAL ASSESSMENT 09/07/2013  Patient:  Seth West,Seth West     Account Number:  [id number]     Admit date:  09/06/2013  Clinical Social Worker:  Carren RangPURITZ,Calyx Hawker, LCSWA  Date/Time:  09/07/2013 10:47 AM  Referred by:  RN  Date Referred:  09/07/2013 Referred for  SNF Placement   Other Referral:   Interview type:  Other - See comment Other interview type:   CSW spoke to patient's daughter over the phone    PSYCHOSOCIAL DATA Living Status:  FACILITY Admitted from facility:  [facility] Level of care:  Skilled Nursing Facility Primary support name:  Seth West Space Primary support relationship to patient:  CHILD, ADULT Degree of support available:   Good    CURRENT CONCERNS Current Concerns  Post-Acute Placement   Other Concerns:    SOCIAL WORK ASSESSMENT / PLAN Per RN, patient is from SNF, unsure of which one and patient is pleasantly confused. CSW made contact with patient's daughter over the phone and explained reason for phone call and csw role. Patient's daughter was receptive to speaking with social worker and confirmed that patient is from SNF, [facility] .Daughter states she wants her dad to return back to [facility] when ready. CSW will complete FL2 and place on chart for MD signature.   Assessment/plan status:  Psychosocial Support/Ongoing Assessment of Needs Other assessment/ plan:   Information/referral to community resources:   CSW contact information    PATIENT'S/FAMILY'S RESPONSE TO PLAN OF CARE: Patient's daughter was agreeable to plan of care and thanked Child psychotherapistsocial worker for following up with her.        Seth West, MSW, Theresia MajorsLCSWA [phone]

## 2013-09-07 NOTE — Telephone Encounter (Addendum)
Message copied by Fredrich BirksMILLIKAN, DANA P on Thu Sep 07, 2013  2:44 PM ------      Message from: Lorin MercyMCCHESNEY, MARILYN K      Created: Wed Sep 06, 2013  9:46 AM      Regarding: Schedule                   ----- Message -----         From: Dara LordsSamantha J Rhyne, PA-C         Sent: 09/06/2013   9:44 AM           To: Vvs Charge Pool            S/p right AKA 09/06/13.  F/u with Dr. Hart RochesterLawson in 4 weeks.  Will need staple removal also.            Thanks,      Lelon MastSamantha ------  09/07/13: lm for [facility], dpm

## 2013-09-07 NOTE — Evaluation (Signed)
Occupational Therapy Evaluation and Discharge Patient Details Name: Seth West MRN: [id number] DOB: [birth year] Today's Date: 09/07/2013    History of Present Illness Patient is a 73 yo male admitted with infection and chronic ischemia Rt foot.  Patient had Rt BKA and went to SNF for rehab.  Readmit for right AKA.  Marland Kitchen.   Clinical Impression   This 73 yo male at SNF pta and needing A pta presents to acute OT with decreased balance, decreased mobility, increased pain , decreased WB'ing RLE all further affecting pt's ability to care for himself. Recommend pt continue OT at SNF, acute OT will sign off.   Follow Up Recommendations  SNF    Equipment Recommendations   (TBD at SNF)       Precautions / Restrictions Precautions Precautions: Fall Restrictions Weight Bearing Restrictions: Yes RLE Weight Bearing: Non weight bearing      Mobility Bed Mobility Overal bed mobility: Needs Assistance;+2 for physical assistance Bed Mobility: Supine to Sit     Supine to sit: Mod assist;+2 for physical assistance     General bed mobility comments: Needed assist to initiate LE movement with use of pad.  Needed assist for full elevation of trunk.  Transfers Overall transfer level: Needs assistance Equipment used: None Transfers: Sit to/from [employer]Stand;Squat Pivot Transfers Sit to Stand: Min assist   Squat pivot transfers: Min assist;+2 physical assistance     General transfer comment: Pt able to reach for left armrest of chair and to pivot on his LLE with min assist by staff to guide pt appropriately to chair.      Balance Overall balance assessment: Needs assistance;History of Falls Sitting-balance support: Bilateral upper extremity supported;Feet supported Sitting balance-Leahy Scale: Fair Sitting balance - Comments: Was able to get balance after PT/OT assisted with initial balance on EOB.                                     ADL                                          General ADL Comments: Overall pt is setup/S for UBADLs sitting EOB and max LBADLs sit lean EOB               Pertinent Vitals/Pain Never did rate pain, but did grimace intermittently throughout session     Hand Dominance Right   Extremity/Trunk Assessment Upper Extremity Assessment Upper Extremity Assessment: Overall WFL for tasks assessed   Lower Extremity Assessment Lower Extremity Assessment: RLE deficits/detail RLE Deficits / Details: AKA limits pt -has difficulty with hip extension RLE: Unable to fully assess due to pain   Cervical / Trunk Assessment Cervical / Trunk Assessment: Kyphotic   Communication Communication Communication: No difficulties   Cognition Arousal/Alertness: Awake/alert Behavior During Therapy: WFL for tasks assessed/performed Overall Cognitive Status: History of cognitive impairments - at baseline                        Exercises Exercises: General Lower Extremity          Home Living Family/patient expects to be discharged to:: Skilled nursing facility  Prior Functioning/Environment Level of Independence: Needs assistance  Gait / Transfers Assistance Needed: Hopped some on LLE ADL's / Homemaking Assistance Needed: Pt reports that he did about 50% and the staff and SNF did about 50%        OT Diagnosis: Generalized weakness;Acute pain   OT Problem List: Decreased strength;Decreased activity tolerance;Impaired balance (sitting and/or standing);Pain;Decreased knowledge of use of DME or AE      OT Goals(Current goals can be found in the care plan section) Acute Rehab OT Goals Patient Stated Goal: to go get therapy  OT Frequency:             Co-evaluation PT/OT/SLP Co-Evaluation/Treatment: Yes Reason for Co-Treatment: For patient/therapist safety PT goals addressed during session: Mobility/safety with mobility OT goals addressed during session:  Strengthening/ROM;ADL's and self-care      End of Session Nurse Communication: Mobility status  Activity Tolerance: Patient tolerated treatment well Patient left: in chair   Time: [id number] OT Time Calculation (min): 28 min Charges:  OT General Charges $OT Visit: 1 Procedure OT Evaluation $Initial OT Evaluation Tier I: 1 Procedure OT Treatments $Self Care/Home Management : 8-22 mins  Evette GeorgesCatherine Eva Javarus Dorner [phone] 09/07/2013, 12:38 PM

## 2013-09-07 NOTE — Progress Notes (Addendum)
[facility] Progress Note  09/07/2013 8:05 AM POD 1  Subjective:  No complaints  Tm 99.5 140's-160's systolic HR 70's-100's regular 100% RA  Filed Vitals:   09/07/13 0429  BP: 142/62  Pulse: 97  Temp: 99.5 F (37.5 C)  Resp: 18    Physical Exam: Incisions:  With dressing in tact.  It is clean and dry.   CBC    Component Value Date/Time   WBC 11.8* 09/07/2013 0248   RBC 3.75* 09/07/2013 0248   HGB 11.1* 09/07/2013 0248   HCT 31.5* 09/07/2013 0248   PLT 246 09/07/2013 0248   MCV 84.0 09/07/2013 0248   MCH 29.6 09/07/2013 0248   MCHC 35.2 09/07/2013 0248   RDW 13.2 09/07/2013 0248   LYMPHSABS 1.9 07/05/2013 1328   MONOABS 1.2* 07/05/2013 1328   EOSABS 0.0 07/05/2013 1328   BASOSABS 0.0 07/05/2013 1328    BMET    Component Value Date/Time   NA 134* 09/07/2013 0248   K 4.4 09/07/2013 0248   CL 94* 09/07/2013 0248   CO2 24 09/07/2013 0248   GLUCOSE 124* 09/07/2013 0248   BUN 9 09/07/2013 0248   CREATININE 0.81 09/07/2013 0248   CALCIUM 9.8 09/07/2013 0248   GFRNONAA 85* 09/07/2013 0248   GFRAA >90 09/07/2013 0248    INR    Component Value Date/Time   INR 1.13 07/07/2013 0619     Intake/Output Summary (Last 24 hours) at 09/07/13 0805 Last data filed at 09/07/13 [id number]  Gross per 24 hour  Intake 2144.17 ml  Output   1492 ml  Net 652.17 ml   Hemovac output:  70cc  Assessment/Plan:  73 y.o. 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 -will change dressing and remove drain tomorrow.   Doreatha MassedSamantha Rhyne, PA-C [facility] [phone] 09/07/2013 8:05 AM  Degree with the above assessment Right AKA dressing dry Minimal Hemovac drainage  Plan check wound in a.m. and DC Hemovac drain Patient will return to skilled nursing facility either Friday or Monday

## 2013-09-07 NOTE — Evaluation (Signed)
Physical Therapy Evaluation Patient Details Name: Seth West MRN: [id number] DOB: Sep 09, 1940 Today's Date: 09/07/2013   History of Present Illness  Patient is a 73 yo male admitted with infection and chronic ischemia Rt foot.  Patient had Rt BKA and went to SNF for rehab.  Readmit for right AKA.  Marland Kitchen.  Clinical Impression  Pt admitted with above. Pt currently with functional limitations due to the deficits listed below (see PT Problem List).  Pt will benefit from skilled PT to increase their independence and safety with mobility to allow discharge to the venue listed below.     Follow Up Recommendations SNF;Supervision/Assistance - 24 hour    Equipment Recommendations  Other (comment) (TBA)    Recommendations for Other Services       Precautions / Restrictions Precautions Precautions: Fall Restrictions Weight Bearing Restrictions: No      Mobility  Bed Mobility Overal bed mobility: Needs Assistance;+2 for physical assistance Bed Mobility: Supine to Sit     Supine to sit: Mod assist;+2 for physical assistance     General bed mobility comments: Needed assist to initiate LE movement with use of pad.  Needed assist for full elevation of trunk.  Transfers Overall transfer level: Needs assistance Equipment used: None Transfers: Sit to/from [employer]Stand;Squat Pivot Transfers Sit to Stand: Min assist   Squat pivot transfers: Min assist;+2 physical assistance     General transfer comment: Pt able to reach for left armrest of chair and to pivot on his LLE with min assist by staff to guide pt appropriately to chair.    Ambulation/Gait                Stairs            Wheelchair Mobility    Modified Rankin (Stroke Patients Only)       Balance Overall balance assessment: Needs assistance;History of Falls Sitting-balance support: Bilateral upper extremity supported;Feet supported Sitting balance-Leahy Scale: Fair Sitting balance - Comments: Was able to get  balance after PT/OT assisted with initial balance on EOB.                                      Pertinent Vitals/Pain VSS, Some pain residual limb but not rated by pt    Home Living Family/patient expects to be discharged to:: Skilled nursing facility                      Prior Function Level of Independence: Needs assistance   Gait / Transfers Assistance Needed: Hopped some on LLE  ADL's / Homemaking Assistance Needed: Pt reports that he did about 50% and the staff and SNF did about 50%        Hand Dominance   Dominant Hand: Right    Extremity/Trunk Assessment   Upper Extremity Assessment: Defer to OT evaluation           Lower Extremity Assessment: RLE deficits/detail RLE Deficits / Details: AKA limits pt -has difficulty with hip extension    Cervical / Trunk Assessment: Kyphotic  Communication   Communication: No difficulties  Cognition Arousal/Alertness: Awake/alert Behavior During Therapy: WFL for tasks assessed/performed Overall Cognitive Status: History of cognitive impairments - at baseline                      General Comments General comments (skin integrity, edema, etc.): Right Residual limb dressing coming off  therefore removed it and redressed appropriately.     Exercises General Exercises - Lower Extremity Ankle Circles/Pumps: AROM;Left;Supine;5 reps Heel Slides: Left;5 reps;Supine      Assessment/Plan    PT Assessment Patient needs continued PT services  PT Diagnosis Acute pain;Generalized weakness   PT Problem List Decreased strength;Decreased activity tolerance;Decreased balance;Decreased mobility;Decreased knowledge of use of DME;Decreased safety awareness;Decreased knowledge of precautions;Pain  PT Treatment Interventions DME instruction;Gait training;Therapeutic activities;Functional mobility training;Therapeutic exercise;Balance training;Patient/family education   PT Goals (Current goals can be found in  the Care Plan section) Acute Rehab PT Goals Patient Stated Goal: to go get therapy PT Goal Formulation: With patient Time For Goal Achievement: 09/14/13 Potential to Achieve Goals: Good    Frequency Min 3X/week   Barriers to discharge Decreased caregiver support      Co-evaluation PT/OT/SLP Co-Evaluation/Treatment: Yes Reason for Co-Treatment: For patient/therapist safety PT goals addressed during session: Mobility/safety with mobility         End of Session Equipment Utilized During Treatment: Gait belt Activity Tolerance: Patient tolerated treatment well Patient left: in chair;with call bell/phone within reach Nurse Communication: Mobility status         Time: [id number] PT Time Calculation (min): 17 min   Charges:   PT Evaluation $Initial PT Evaluation Tier I: 1 Procedure PT Treatments $Therapeutic Activity: 8-22 mins   PT G Codes:          Barb MerinoDawn Ingold 09/07/2013, 11:16 AM Audree Camelawn Ingold,PT Acute Rehabilitation [phone] (pager)

## 2013-09-07 NOTE — Care Management Note (Signed)
    Page 1 of 1   09/08/2013     4:26:05 PM CARE MANAGEMENT NOTE 09/08/2013  Patient:  [name],Cashius   Account Number:  [id number]  Date Initiated:  09/07/2013  Documentation initiated by:  Darlyne Schmiesing  Subjective/Objective Assessment:   PT ADM S/P BKA TO AKA ON 4/16.  [facility]     Action/Plan:   WILL CONSULT CSW TO FACILITATE DC TO SNF WHEN MEDICALLY STABLE FOR DC.   Anticipated DC Date:  09/09/2013   Anticipated DC Plan:  SKILLED NURSING FACILITY  In-house referral  Clinical Social Worker      DC Planning Services  CM consult      Choice offered to / List presented to:             Status of service:  Completed, signed off Medicare Important Message given?   (If response is "NO", the following Medicare IM given date fields will be blank) Date Medicare IM given:   Date Additional Medicare IM given:    Discharge Disposition:  SKILLED NURSING FACILITY  Per UR Regulation:  Reviewed for med. necessity/level of care/duration of stay  If discussed at Long Length of Stay Meetings, dates discussed:    Comments:  09/08/13 Arsenia Goracke,RN,BSN [phone] PT DISCHARGED BACK TO [facility] TODAY, PER CSW ARRANGEMENTS.

## 2013-09-08 LAB — BASIC METABOLIC PANEL
BUN: 13 mg/dL (ref 6–23)
CALCIUM: 9.8 mg/dL (ref 8.4–10.5)
CO2: 21 meq/L (ref 19–32)
Chloride: 100 mEq/L (ref 96–112)
Creatinine, Ser: 0.79 mg/dL (ref 0.50–1.35)
GFR calc Af Amer: >90 mL/min (ref >90)
GFR, EST NON AFRICAN AMERICAN: 86 mL/min — AB (ref >90)
GLUCOSE: 86 mg/dL (ref 70–99)
Potassium: 4.1 mEq/L (ref 3.7–5.3)
SODIUM: 136 meq/L — AB (ref 137–147)

## 2013-09-08 LAB — CBC
HCT: 30.7 % — ABNORMAL LOW (ref 39.0–52.0)
Hemoglobin: 10.5 g/dL — ABNORMAL LOW (ref 13.0–17.0)
MCH: 29.4 pg (ref 26.0–34.0)
MCHC: 34.2 g/dL (ref 30.0–36.0)
MCV: 86 fL (ref 78.0–100.0)
PLATELETS: 256 10*3/uL (ref 150–400)
RBC: 3.57 MIL/uL — AB (ref 4.22–5.81)
RDW: 13.4 % (ref 11.5–15.5)
WBC: 8.5 10*3/uL (ref 4.0–10.5)

## 2013-09-08 MED ORDER — OXYCODONE HCL 5 MG PO TABS: 5.0000 mg | ORAL_TABLET | ORAL | Status: DC

## 2013-09-08 NOTE — Progress Notes (Signed)
Clinical Social Worker facilitated patient discharge by contacting the patient, family and facility, [facility]. Patient agreeable to this plan and arranging transport via EMS.. CSW will sign off, as social work intervention is no longer needed.  Maree KrabbeLindsay Khyren Hing, MSW, Theresia MajorsLCSWA [phone]

## 2013-09-08 NOTE — Discharge Summary (Signed)
[facility] Discharge Summary  Seth West [birth year] 73 y.o. male  [id number]  Admission Date: 09/06/2013  Discharge Date: 09/08/13  Physician: Pryor OchoaJames D Lawson, MD  Admission Diagnosis: nonhealing right below knee amputation   HPI:   This is a 73 y.o. male had a right BKA performed by me on 07/05/2013 4 severe gangrene of the right foot. The lateral aspect of the amputation stump has never healed properly. He returns today for further examination. He continues to have some pain and discomfort in this area but no chills or fever.  Hospital Course:  The patient was admitted to the hospital and taken to the operating room on 09/06/2013 and underwent: Right above knee amputatioin   The pt tolerated the procedure well and was transported to the PACU in good condition. By POD 1, he was doing well and pain was well controlled.  On POD 2, his dressing was taken down and drain removed.  His incision was c/d/i with staples.  The remainder of the hospital course consisted of increasing mobilization and increasing intake of solids without difficulty.  CBC    Component Value Date/Time   WBC 8.5 09/08/2013 0351   RBC 3.57* 09/08/2013 0351   HGB 10.5* 09/08/2013 0351   HCT 30.7* 09/08/2013 0351   PLT 256 09/08/2013 0351   MCV 86.0 09/08/2013 0351   MCH 29.4 09/08/2013 0351   MCHC 34.2 09/08/2013 0351   RDW 13.4 09/08/2013 0351   LYMPHSABS 1.9 07/05/2013 1328   MONOABS 1.2* 07/05/2013 1328   EOSABS 0.0 07/05/2013 1328   BASOSABS 0.0 07/05/2013 1328    BMET    Component Value Date/Time   NA 136* 09/08/2013 0351   K 4.1 09/08/2013 0351   CL 100 09/08/2013 0351   CO2 21 09/08/2013 0351   GLUCOSE 86 09/08/2013 0351   BUN 13 09/08/2013 0351   CREATININE 0.79 09/08/2013 0351   CALCIUM 9.8 09/08/2013 0351   GFRNONAA 86* 09/08/2013 0351   GFRAA >90 09/08/2013 0351     Discharge Instructions:   The patient is discharged to home with extensive instructions on wound care and  progressive ambulation.  They are instructed not to drive or perform any heavy lifting until returning to see the physician in his office.  Discharge Orders   Future Appointments Provider Department Dept Phone   10/10/2013 8:45 AM Pryor OchoaJames D Lawson, MD [facility] [phone]   Future Orders Complete By Expires   Call MD for:  redness, tenderness, or signs of infection (pain, swelling, bleeding, redness, odor or green/yellow discharge around incision site)  As directed    Call MD for:  severe or increased pain, loss or decreased feeling  in affected limb(s)  As directed    Call MD for:  temperature >100.5  As directed    Discharge wound care:  As directed    may wash over wound with mild soap and water  As directed    Resume previous diet  As directed       Discharge Diagnosis:  nonhealing right below knee amputation  Secondary Diagnosis: Patient Active Problem List   Diagnosis Date Noted  . Non-healing wound of amputation stump 09/06/2013  . Atherosclerosis of native arteries of the extremities with gangrene 08/08/2013  . AKI (acute kidney injury) 07/09/2013  . Ischemic foot 07/05/2013  . Sepsis 07/05/2013  . Peripheral vascular disease 07/05/2013  . Metabolic acidosis, increased anion gap 07/05/2013  . Transaminitis 07/05/2013  . HYPERTENSION 09/04/2009  Past Medical History  Diagnosis Date  . Hypertension     has been off meds for years.   . Peripheral vascular disease   . 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 Dementia     hx unreliablr pt confused       Medication List    STOP taking these medications       AUGMENTIN 875-125 MG per tablet  Generic drug:  amoxicillin-clavulanate      TAKE these medications       amLODipine 5 MG tablet  Commonly known as:  NORVASC  Take 1 tablet (5 mg total) by mouth 2 (two) times daily.     metoprolol tartrate 25 MG tablet  Commonly known as:  LOPRESSOR  Take 1 tablet (25 mg  total) by mouth 2 (two) times daily.     oxyCODONE 5 MG immediate release tablet  Commonly known as:  Oxy IR/ROXICODONE  Take 1 tablet (5 mg total) by mouth every 4 (four) hours.        Roxicodone #30 No Refill  Disposition: SNF  Patient's condition: is Good  Follow up: 1. Dr. Hart RochesterLawson in 4 weeks   Doreatha MassedSamantha Tahj Lindseth, PA-C [facility] [phone] 09/08/2013  11:11 AM

## 2013-09-08 NOTE — Progress Notes (Signed)
[facility] SN[facility] is ready for patient when medically stable.  Maree KrabbeLindsay Anastassia Noack, MSW, Theresia MajorsLCSWA [phone]

## 2013-09-08 NOTE — Progress Notes (Signed)
[facility]. [facility], [facility] unable to transport. Trying to find a way to get it back to them.

## 2013-09-08 NOTE — Progress Notes (Signed)
Orthopedic Tech Progress Note Patient Details:  Seth West [birth year] [id number] [employer] called for brace order Patient ID: Seth West, male   DOB: [birth year], 73 y.o.   MRN: [id number]   [name] 09/08/2013, 10:37 AM

## 2013-09-08 NOTE — Progress Notes (Addendum)
[facility] Progress Note  09/08/2013 7:40 AM POD 2  Subjective:  No complaints  Tm 99.3 now afebrile VSS   Filed Vitals:   09/08/13 0415  BP: 140/78  Pulse: 83  Temp: 98.6 F (37 C)  Resp: 18    Physical Exam: Incisions:  C/d/i with staples in tact Extremities:  Good ROM  CBC    Component Value Date/Time   WBC 8.5 09/08/2013 0351   RBC 3.57* 09/08/2013 0351   HGB 10.5* 09/08/2013 0351   HCT 30.7* 09/08/2013 0351   PLT 256 09/08/2013 0351   MCV 86.0 09/08/2013 0351   MCH 29.4 09/08/2013 0351   MCHC 34.2 09/08/2013 0351   RDW 13.4 09/08/2013 0351   LYMPHSABS 1.9 07/05/2013 1328   MONOABS 1.2* 07/05/2013 1328   EOSABS 0.0 07/05/2013 1328   BASOSABS 0.0 07/05/2013 1328    BMET    Component Value Date/Time   NA 136* 09/08/2013 0351   K 4.1 09/08/2013 0351   CL 100 09/08/2013 0351   CO2 21 09/08/2013 0351   GLUCOSE 86 09/08/2013 0351   BUN 13 09/08/2013 0351   CREATININE 0.79 09/08/2013 0351   CALCIUM 9.8 09/08/2013 0351   GFRNONAA 86* 09/08/2013 0351   GFRAA >90 09/08/2013 0351    INR    Component Value Date/Time   INR 1.13 07/07/2013 0619     Intake/Output Summary (Last 24 hours) at 09/08/13 0740 Last data filed at 09/08/13 0600  Gross per 24 hour  Intake    740 ml  Output   1015 ml  Net   -275 ml     Assessment/Plan:  73 y.o. male is s/p right above knee amputation  POD 2  -doing well -stump is clean with staples in tact -stump is viable -will order a retention sock from [employer]  -possibly back to SNF today when bed available    Doreatha MassedSamantha Rhyne, PA-C [facility] [phone] 09/08/2013 7:40 AM  Agree with above assessment AKA healing nicely Drain removed Hematocrit 30%  [facility] if that can be arranged

## 2013-09-08 NOTE — Progress Notes (Signed)
Vendor here to deliver and apply retention sock. Extra sock provided for pt. [facility]

## 2013-10-09 ENCOUNTER — Encounter: Payer: Self-pay | Admitting: Vascular Surgery

## 2013-10-10 ENCOUNTER — Encounter: Payer: Self-pay | Admitting: Vascular Surgery

## 2013-10-10 ENCOUNTER — Ambulatory Visit (INDEPENDENT_AMBULATORY_CARE_PROVIDER_SITE_OTHER): Payer: Self-pay | Admitting: Vascular Surgery

## 2013-10-10 VITALS — BP 159/91 | HR 83 | Resp 14 | Ht 69.0 in | Wt 150.0 lb

## 2013-10-10 DIAGNOSIS — I70269 Atherosclerosis of native arteries of extremities with gangrene, unspecified extremity: Secondary | ICD-10-CM

## 2013-10-10 NOTE — Progress Notes (Signed)
Subjective:     Patient ID: Seth West, male   DOB: 05-22-1941, [age] y.o.   MRN: [id number]  HPI this [age] year old male is 4 weeks status post right AKA for a nonhealing BKA. He has no complaints. He denies pain but does have some phantom sensation spear   Review of Systems     Objective:   Physical Exam BP 159/91  Pulse 83  Resp 14  Ht 5\' 9"  (1.753 m)  Wt 150 lb (68.04 kg)  BMI 22.14 kg/m2  Gen. elderly male no apparent distress in a wheelchair Right AKA stump is healed nicely. Skin staples removed. No evidence of infection. Left leg free of ulcerations.      Assessment:     Doing well 4 weeks post right AKA following a nonhealing right BKA    Plan:     Return to see me on a when necessary basis

## 2013-11-28 ENCOUNTER — Encounter (HOSPITAL_COMMUNITY): Payer: Self-pay | Admitting: Emergency Medicine

## 2013-11-28 ENCOUNTER — Emergency Department (HOSPITAL_COMMUNITY): Payer: Medicare Other

## 2013-11-28 ENCOUNTER — Inpatient Hospital Stay (HOSPITAL_COMMUNITY)
Admission: EM | Admit: 2013-11-28 | Discharge: 2013-12-04 | DRG: 064 | Disposition: A | Discharge status: Hospice | Payer: Medicare Other | Attending: Pulmonary Disease | Admitting: Pulmonary Disease

## 2013-11-28 DIAGNOSIS — E872 Acidosis, unspecified: Secondary | ICD-10-CM

## 2013-11-28 DIAGNOSIS — I498 Other specified cardiac arrhythmias: Secondary | ICD-10-CM | POA: Diagnosis not present

## 2013-11-28 DIAGNOSIS — Z87891 Personal history of nicotine dependence: Secondary | ICD-10-CM

## 2013-11-28 DIAGNOSIS — R9431 Abnormal electrocardiogram [ECG] [EKG]: Secondary | ICD-10-CM

## 2013-11-28 DIAGNOSIS — F0390 Unspecified dementia without behavioral disturbance: Secondary | ICD-10-CM | POA: Diagnosis present

## 2013-11-28 DIAGNOSIS — G929 Unspecified toxic encephalopathy: Secondary | ICD-10-CM | POA: Diagnosis present

## 2013-11-28 DIAGNOSIS — E785 Hyperlipidemia, unspecified: Secondary | ICD-10-CM

## 2013-11-28 DIAGNOSIS — R40244 Other coma, without documented Glasgow coma scale score, or with partial score reported, unspecified time: Secondary | ICD-10-CM

## 2013-11-28 DIAGNOSIS — S78119A Complete traumatic amputation at level between unspecified hip and knee, initial encounter: Secondary | ICD-10-CM

## 2013-11-28 DIAGNOSIS — IMO0002 Reserved for concepts with insufficient information to code with codable children: Secondary | ICD-10-CM | POA: Diagnosis not present

## 2013-11-28 DIAGNOSIS — R000 Tachycardia, unspecified: Secondary | ICD-10-CM

## 2013-11-28 DIAGNOSIS — J9600 Acute respiratory failure, unspecified whether with hypoxia or hypercapnia: Secondary | ICD-10-CM | POA: Diagnosis present

## 2013-11-28 DIAGNOSIS — Z8249 Family history of ischemic heart disease and other diseases of the circulatory system: Secondary | ICD-10-CM

## 2013-11-28 DIAGNOSIS — Z66 Do not resuscitate: Secondary | ICD-10-CM | POA: Diagnosis not present

## 2013-11-28 DIAGNOSIS — I639 Cerebral infarction, unspecified: Secondary | ICD-10-CM

## 2013-11-28 DIAGNOSIS — Z8673 Personal history of transient ischemic attack (TIA), and cerebral infarction without residual deficits: Secondary | ICD-10-CM

## 2013-11-28 DIAGNOSIS — I472 Ventricular tachycardia, unspecified: Secondary | ICD-10-CM | POA: Diagnosis present

## 2013-11-28 DIAGNOSIS — J9601 Acute respiratory failure with hypoxia: Secondary | ICD-10-CM

## 2013-11-28 DIAGNOSIS — R4020 Unspecified coma: Secondary | ICD-10-CM | POA: Diagnosis present

## 2013-11-28 DIAGNOSIS — R4182 Altered mental status, unspecified: Secondary | ICD-10-CM

## 2013-11-28 DIAGNOSIS — Z515 Encounter for palliative care: Secondary | ICD-10-CM

## 2013-11-28 DIAGNOSIS — R4789 Other speech disturbances: Secondary | ICD-10-CM | POA: Diagnosis present

## 2013-11-28 DIAGNOSIS — Z79899 Other long term (current) drug therapy: Secondary | ICD-10-CM

## 2013-11-28 DIAGNOSIS — I6350 Cerebral infarction due to unspecified occlusion or stenosis of unspecified cerebral artery: Principal | ICD-10-CM | POA: Diagnosis present

## 2013-11-28 DIAGNOSIS — I10 Essential (primary) hypertension: Secondary | ICD-10-CM

## 2013-11-28 DIAGNOSIS — I739 Peripheral vascular disease, unspecified: Secondary | ICD-10-CM

## 2013-11-28 DIAGNOSIS — G928 Other toxic encephalopathy: Secondary | ICD-10-CM

## 2013-11-28 DIAGNOSIS — I4729 Other ventricular tachycardia: Secondary | ICD-10-CM | POA: Diagnosis present

## 2013-11-28 DIAGNOSIS — G92 Toxic encephalopathy: Secondary | ICD-10-CM | POA: Diagnosis present

## 2013-11-28 DIAGNOSIS — I70269 Atherosclerosis of native arteries of extremities with gangrene, unspecified extremity: Secondary | ICD-10-CM | POA: Diagnosis present

## 2013-11-28 DIAGNOSIS — R40243 Glasgow coma scale score 3-8, unspecified time: Secondary | ICD-10-CM

## 2013-11-28 LAB — RAPID URINE DRUG SCREEN, HOSP PERFORMED
AMPHETAMINES: NOT DETECTED
Barbiturates: NOT DETECTED
Benzodiazepines: NOT DETECTED
Cocaine: NOT DETECTED
Opiates: NOT DETECTED
Tetrahydrocannabinol: NOT DETECTED

## 2013-11-28 LAB — I-STAT ARTERIAL BLOOD GAS, ED
Acid-Base Excess: 1 mmol/L (ref 0.0–2.0)
Bicarbonate: 25.6 mEq/L — ABNORMAL HIGH (ref 20.0–24.0)
O2 SAT: 94 %
PO2 ART: 68 mmHg — AB (ref 80.0–100.0)
Patient temperature: 97.2
TCO2: 27 mmol/L (ref 0–100)
pCO2 arterial: 39.4 mmHg (ref 35.0–45.0)
pH, Arterial: 7.416 (ref 7.350–7.450)

## 2013-11-28 LAB — COMPREHENSIVE METABOLIC PANEL
ALT: 18 U/L (ref 0–53)
AST: 21 U/L (ref 0–37)
Albumin: 3.9 g/dL (ref 3.5–5.2)
Alkaline Phosphatase: 102 U/L (ref 39–117)
Anion gap: 13 (ref 5–15)
BUN: 17 mg/dL (ref 6–23)
CALCIUM: 10.1 mg/dL (ref 8.4–10.5)
CHLORIDE: 102 meq/L (ref 96–112)
CO2: 26 mEq/L (ref 19–32)
CREATININE: 0.88 mg/dL (ref 0.50–1.35)
GFR calc Af Amer: >90 mL/min (ref >90)
GFR calc non Af Amer: 82 mL/min — ABNORMAL LOW (ref >90)
Glucose, Bld: 123 mg/dL — ABNORMAL HIGH (ref 70–99)
Potassium: 4.4 mEq/L (ref 3.7–5.3)
Sodium: 141 mEq/L (ref 137–147)
Total Bilirubin: 0.5 mg/dL (ref 0.3–1.2)
Total Protein: 8.4 g/dL — ABNORMAL HIGH (ref 6.0–8.3)

## 2013-11-28 LAB — CBC
HCT: 37.5 % — ABNORMAL LOW (ref 39.0–52.0)
Hemoglobin: 12.9 g/dL — ABNORMAL LOW (ref 13.0–17.0)
MCH: 28.9 pg (ref 26.0–34.0)
MCHC: 34.4 g/dL (ref 30.0–36.0)
MCV: 83.9 fL (ref 78.0–100.0)
PLATELETS: 178 10*3/uL (ref 150–400)
RBC: 4.47 MIL/uL (ref 4.22–5.81)
RDW: 13.6 % (ref 11.5–15.5)
WBC: 8 10*3/uL (ref 4.0–10.5)

## 2013-11-28 LAB — URINALYSIS, ROUTINE W REFLEX MICROSCOPIC
Bilirubin Urine: NEGATIVE
GLUCOSE, UA: NEGATIVE mg/dL
Hgb urine dipstick: NEGATIVE
Ketones, ur: NEGATIVE mg/dL
Leukocytes, UA: NEGATIVE
Nitrite: NEGATIVE
PH: 5.5 (ref 5.0–8.0)
Protein, ur: NEGATIVE mg/dL
Specific Gravity, Urine: 1.022 (ref 1.005–1.030)
Urobilinogen, UA: 0.2 mg/dL (ref 0.0–1.0)

## 2013-11-28 LAB — SALICYLATE LEVEL: Salicylate Lvl: <2 mg/dL — ABNORMAL LOW (ref 2.8–20.0)

## 2013-11-28 LAB — LACTIC ACID, PLASMA: Lactic Acid, Venous: 3.1 mmol/L — ABNORMAL HIGH (ref 0.5–2.2)

## 2013-11-28 LAB — AMMONIA: Ammonia: 17 umol/L (ref 11–60)

## 2013-11-28 LAB — TSH: TSH: 0.991 u[IU]/mL (ref 0.350–4.500)

## 2013-11-28 LAB — ACETAMINOPHEN LEVEL: Acetaminophen (Tylenol), Serum: <15 ug/mL (ref 10–30)

## 2013-11-28 LAB — TROPONIN I

## 2013-11-28 LAB — MRSA PCR SCREENING: MRSA BY PCR: NEGATIVE

## 2013-11-28 LAB — PROCALCITONIN: Procalcitonin: <0.1 ng/mL

## 2013-11-28 LAB — I-STAT TROPONIN, ED: Troponin i, poc: 0 ng/mL (ref 0.00–0.08)

## 2013-11-28 LAB — ETHANOL: Alcohol, Ethyl (B): <11 mg/dL (ref 0–11)

## 2013-11-28 LAB — I-STAT CG4 LACTIC ACID, ED: Lactic Acid, Venous: 2.68 mmol/L — ABNORMAL HIGH (ref 0.5–2.2)

## 2013-11-28 MED ORDER — LORAZEPAM 2 MG/ML IJ SOLN
1.0000 mg | Freq: Once | INTRAMUSCULAR | Status: AC
  Administered 2013-11-28: 1 mg via INTRAVENOUS
  Filled 2013-11-28: qty 1

## 2013-11-28 MED ORDER — SODIUM CHLORIDE 0.9 % IJ SOLN
3.0000 mL | Freq: Two times a day (BID) | INTRAMUSCULAR | Status: DC
  Administered 2013-11-29 – 2013-12-01 (×3): 3 mL via INTRAVENOUS

## 2013-11-28 MED ORDER — SODIUM CHLORIDE 0.9 % IV SOLN
INTRAVENOUS | Status: DC
  Administered 2013-11-28 – 2013-11-29 (×2): 1000 mL via INTRAVENOUS
  Administered 2013-11-29 – 2013-12-01 (×3): via INTRAVENOUS

## 2013-11-28 MED ORDER — METOPROLOL TARTRATE 1 MG/ML IV SOLN
5.0000 mg | Freq: Once | INTRAVENOUS | Status: AC
  Administered 2013-11-28: 5 mg via INTRAVENOUS
  Filled 2013-11-28: qty 5

## 2013-11-28 MED ORDER — HYDRALAZINE HCL 20 MG/ML IJ SOLN: 5.0000 mg | Freq: Four times a day (QID) | INTRAMUSCULAR | Status: DC | PRN

## 2013-11-28 MED ORDER — AMLODIPINE BESYLATE 2.5 MG PO TABS
5.0000 mg | ORAL_TABLET | Freq: Two times a day (BID) | ORAL | Status: DC
  Administered 2013-11-29: 5 mg via ORAL
  Filled 2013-11-28 (×5): qty 1

## 2013-11-28 MED ORDER — ENOXAPARIN SODIUM 40 MG/0.4ML ~~LOC~~ SOLN
40.0000 mg | SUBCUTANEOUS | Status: DC
  Administered 2013-11-28 – 2013-11-30 (×3): 40 mg via SUBCUTANEOUS
  Filled 2013-11-28 (×4): qty 0.4

## 2013-11-28 MED ORDER — HALOPERIDOL LACTATE 5 MG/ML IJ SOLN
1.0000 mg | Freq: Four times a day (QID) | INTRAMUSCULAR | Status: DC | PRN
  Administered 2013-11-28: 1 mg via INTRAMUSCULAR
  Filled 2013-11-28 (×2): qty 1

## 2013-11-28 MED ORDER — METOPROLOL TARTRATE 25 MG PO TABS
25.0000 mg | ORAL_TABLET | Freq: Two times a day (BID) | ORAL | Status: DC
  Administered 2013-11-29: 25 mg via ORAL
  Filled 2013-11-28 (×5): qty 1

## 2013-11-28 MED ORDER — SODIUM CHLORIDE 0.9 % IV BOLUS (SEPSIS): 500.0000 mL | Freq: Once | INTRAVENOUS | Status: DC

## 2013-11-28 MED ORDER — HALOPERIDOL 1 MG PO TABS
1.0000 mg | ORAL_TABLET | Freq: Four times a day (QID) | ORAL | Status: DC | PRN
  Filled 2013-11-28: qty 1

## 2013-11-28 MED ORDER — SODIUM CHLORIDE 0.9 % IV BOLUS (SEPSIS)
1000.0000 mL | Freq: Once | INTRAVENOUS | Status: AC
Start: 2013-11-28 — End: 2013-11-28
  Administered 2013-11-28: 1000 mL via INTRAVENOUS

## 2013-11-28 MED ORDER — SODIUM CHLORIDE 0.9 % IV BOLUS (SEPSIS): 1000.0000 mL | Freq: Once | INTRAVENOUS | Status: DC

## 2013-11-28 NOTE — ED Notes (Addendum)
Per EMS pt has ams since 10 am per daughter, Rudean Curtnh stated pt not "acting right" since last pm. Pt has minimal response to deep sternal rub and ammonia inhalent. Maintaining airway without difficulty. Vomitx2 per ems Zofran 4mg  given. Pupils unequal. MAE randomly spontanously

## 2013-11-28 NOTE — ED Provider Notes (Signed)
CSN: [id number]     Arrival date & time 11/28/13  1119 History   First MD Initiated Contact with Patient 11/28/13 1141     Chief Complaint  Patient presents with  . Altered Mental Status   HPI Comments: Patient is a 73 y.o. Male who [facility] for [facility] status.  Patient is reported to have been normal by nursing home last night.  Daughter reports that when she went to see her father this morning he had some slurred speech, was very lethargic, and was laying in a puddle of his own vomit.  Daughter reports that the patient at baseline is able to hold conversation and is alert and oriented.  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  Patient has a 150 pack year history of smoking and hypertension which is poorly controlled.    Patient is a 73 y.o. male presenting with altered mental status. The history is provided by the patient. No language interpreter was used.  Altered Mental Status   Past Medical History  Diagnosis Date  . Hypertension     has been off meds for years.   . Peripheral vascular disease   . 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 Dementia     hx unreliablr pt confused   Past Surgical History  Procedure Laterality Date  . Amputation Right 07/07/2013    Procedure: AMPUTATION BELOW KNEE- RIGHT;  Surgeon: Pryor OchoaJames D Lawson, MD;  [facility];  Service: Vascular;  Laterality: Right;  . Amputation Right 09/06/2013    Procedure: RIGHT ABOVE KNEE AMPUTATION;  Surgeon: Pryor OchoaJames D Lawson, MD;  [facility];  Service: Vascular;  Laterality: Right;   History reviewed. No pertinent family history. History  Substance Use Topics  . Smoking status: Former Smoker -- 3.00 packs/day for 50 years    Types: Cigarettes    Quit date: 05/11/2013  . Smokeless tobacco: Never Used     Comment: 07/05/2013 "quit smoking cigarettes the week before  Christmas"  . Alcohol Use: Yes     Comment: 07/05/2013 "drinks 1 quart/day"/notes 4/05/27/2009; denies alcohol hx on 07/05/2013    Review of Systems  Unable to perform ROS: Mental status change      Allergies  Review of patient's allergies indicates no known allergies.  Home Medications   Prior to Admission medications   Medication Sig Start Date End Date Taking? Authorizing Provider  amLODipine (NORVASC) 5 MG tablet Take 5 mg by mouth 2 (two) times daily.   Yes Historical Provider, MD  metoprolol tartrate (LOPRESSOR) 25 MG tablet Take 25 mg by mouth 2 (two) times daily.   Yes Historical Provider, MD  oxyCODONE (OXY IR/ROXICODONE) 5 MG immediate release tablet Take 5 mg by mouth every 4 (four) hours as needed for severe pain.   Yes Historical Provider, MD  Vitamin D, Ergocalciferol, (DRISDOL) 50000 UNITS CAPS capsule Take 50,000 Units by mouth every 7 (seven) days.   Yes Historical Provider, MD   BP 167/101  Pulse 99  Temp(Src) 97 F (36.1 C) (Other (Comment))  Resp 15  SpO2 99% Physical Exam  Nursing note and vitals reviewed. Constitutional: He appears lethargic.  Patient is lethargic and minimally responsive to sternal rub.  Glasgow coma score 12  HENT:  Head: Normocephalic and atraumatic.  Mouth/Throat: Uvula is midline and oropharynx is clear and  moist. Mucous membranes are pale, not dry and not cyanotic.  Eyes: Conjunctivae are normal. Pupils are unequal.  Left eye 4 mm, right eye 1 mm.  There is an opacity of the left lense  Neck: No JVD present. No tracheal deviation present.  Cardiovascular: Regular rhythm, normal heart sounds and intact distal pulses.  Tachycardia present.  Exam reveals no gallop and no friction rub.   No murmur heard. Pulmonary/Chest: Effort normal and breath sounds normal. No respiratory distress. He has no wheezes. He has no rales.  Abdominal: Soft. Bowel sounds are normal. He exhibits no distension and no mass. There is no rebound and no guarding.   Lymphadenopathy:    He has no cervical adenopathy.  Neurological: He appears lethargic.  Patient able to identify his daughter and moves extremities x 3.  There is an above the knee amputation of the right leg.  Skin: Skin is warm and dry.  Moisture noted between the toes of the right foot.      ED Course  Procedures (including critical care time) Labs Review Labs Reviewed  CBC - Abnormal; Notable for the following:    Hemoglobin 12.9 (*)    HCT 37.5 (*)    All other components within normal limits  COMPREHENSIVE METABOLIC PANEL - Abnormal; Notable for the following:    Glucose, Bld 123 (*)    Total Protein 8.4 (*)    GFR calc non Af Amer 82 (*)    All other components within normal limits  LACTIC ACID, PLASMA - Abnormal; Notable for the following:    Lactic Acid, Venous 3.1 (*)    All other components within normal limits  I-STAT CG4 LACTIC ACID, ED - Abnormal; Notable for the following:    Lactic Acid, Venous 2.68 (*)    All other components within normal limits  I-STAT ARTERIAL BLOOD GAS, ED - Abnormal; Notable for the following:    pO2, Arterial 68.0 (*)    Bicarbonate 25.6 (*)    All other components within normal limits  URINE CULTURE  CULTURE, BLOOD (ROUTINE X 2)  CULTURE, BLOOD (ROUTINE X 2)  URINALYSIS, ROUTINE W REFLEX MICROSCOPIC  URINE RAPID DRUG SCREEN (HOSP PERFORMED)  BLOOD GAS, ARTERIAL  CBG MONITORING, ED  I-STAT TROPOININ, ED    Imaging Review Ct Head Wo Contrast  11/28/2013   CLINICAL DATA:  Altered mental status.  Hypertension.  Dementia.  EXAM: CT HEAD WITHOUT CONTRAST  TECHNIQUE: Contiguous axial images were obtained from the base of the skull through the vertex without intravenous contrast.  COMPARISON:  None.  FINDINGS: No intracranial hemorrhage.  Remote basal ganglia infarcts bilaterally. Remote left corona radiata infarct with encephalomalacia. Remote small left cerebellar infarct. No CT evidence of large acute infarct.  Mineralization globus  pallidus.  Global atrophy without hydrocephalus.  No intracranial mass lesion noted on this unenhanced exam.  Vascular calcifications.  Opacification inferior medial frontal sinuses and ethmoid sinus air cells with mucosal thickening anterior aspect of the sphenoid sinus air cells. Moderate mucosal thickening maxillary sinuses with evidence of prior maxillary sinus surgery.  IMPRESSION: Remote infarcts and small vessel disease type changes without CT evidence of large acute infarct.  Atrophy without hydrocephalus.  Vascular calcifications.  Paranasal sinus mucosal thickening/ partial opacification as noted above.   Electronically Signed   By: Bridgett LarssonSteve  Olson M.D.   On: 11/28/2013 14:52   Dg Chest Portable 1 View  11/28/2013   CLINICAL DATA:  Altered mental status  EXAM: PORTABLE CHEST - 1  VIEW  COMPARISON:  PA and lateral chest of September 06, 2013  FINDINGS: The lungs are reasonably well inflated. There is no focal infiltrate. The cardiopericardial silhouette is enlarged. The pulmonary vascularity is not engorged. There is no pleural effusion. The mediastinum is mildly prominent but stable. The right clavicle exhibits evidence of an old fracture.  IMPRESSION: There is stable enlargement of the cardiac silhouette without significant pulmonary vascular congestion nor evidence of pneumonia.   Electronically Signed   By: David  [place]   On: 11/28/2013 12:38     EKG Interpretation   Date/Time:  Tuesday November 28 2013 11:31:37 EDT Ventricular Rate:  103 PR Interval:  152 QRS Duration: 80 QT Interval:  349 QTC Calculation: 457 R Axis:   38 Text Interpretation:  Sinus tachycardia Nonspecific T abnormalities,  lateral leads Borderline ST elevation, anterior leads Abnormal ECG  Confirmed by BEATON  MD, ROBERT ([id number]) on 11/28/2013 11:39:15 AM      MDM   Final diagnoses:  Altered mental status, unspecified altered mental status type   Patient is a 73 y.o. Male who presents to the ED with altered mental status.   Source of mental status change appears to be unclear at this time.  CBC is without leukocytosis.  UA shows no infection.  CXR does not appear to show any pneumonia at this time.  The patient does have an elevation of lactic acid which further increased after 2 hours here in the ED despite 1 L of fluids.  Head CT did not show any acute abnormalities at this time.  Patient was admitted by the internal medicine teaching service with the attending Dr. Randolm Idol for further workup and treatment.  Patient was also seen by Dr. Radford Pax who agrees with the above plan.       Lily Peer Forcucci, PA-C 11/28/13 1700

## 2013-11-28 NOTE — H&P (Signed)
Family Medicine Teaching [facility] Admission History and Physical Service Pager: [phone]  Patient name: Seth West Medical record number: [id number] Date of birth: 11/30/1940 Age: 73 y.o. Gender: male  Primary Care Provider: Pcp Not In System Consultants: None Code Status: Full   Chief Complaint: Change in Mental Status   Assessment and Plan: Seth West is a 73 y.o. male presenting with acute encephalopathy. PMH is significant for Right BKA, HTN, PVD.   # Acute Toxic Metabolic Encephalopathy with lactic acidosis - UDS, CBC, CT head, CXR, UA, i stat trop, ABG, CMP all without revealing cause.  DDx is quite broad including centrally neurologic, toxic, ischemic.   - Admit to telemetry, vitals per unit - Soft restraints as needed, haldol 1 mg PRN q 6 hrs for agitation (QTc 457 ms) - Will complete AMS w/u with ethanol, Acetaminophen, ASA, ammonia, TSH.  Can consider BCx as well but no evidence of infectious source.  Repeat one more troponin to r/o ischemic origin of hypoperfusion.  - F/U UCx despite negative UA - Repeat Lactic acid in AM after fluids  - Hold all sedating medications  - If no improvement in mentation, consider EEG and neurology consult   # HTN  - Continue home medications  # Peripheral Vascular Disease - Continue home medications   FEN/GI: NPO, NS @ 100 cc/hr Prophylaxis: SQ Lovenox   Disposition: Telemetry   History of Present Illness: Seth West is a 73 y.o. male presenting with altered mental status.  Pt's daughter providing history as pt is unable to communicate at this time period.  Pt's daughter states he was in his normal state of health (able to feed himself, dress himself, slightly demented but would know his name, time of year, location when in nursing home) up until yesterday.  Pt's daughter visited him yesterday when he took two of his blood pressure pills and was doing well.  Pt's daughter went back this AM and noticed that he had  had emesis on himself and was slightly lethargic, at which point recommended ED evaluation.  Pt's daughter denies he had been febrile, nauseas, sick contacts, travel, changes in mental status or neurologic status, chest pain, shortness of breath.     In the ED, pt had multiple evaluations performed including POC troponin which was negative, CXR which does not show PNA, CT head which does not show acute changes, CMET basically normal, CBC basically normal, Lactic Acid elevated to 3.1, ABG with normal pH but O2 of 68, UDS which was negative.  He was given one dose of Metoprolol for elevated pressures and a 1 L NS bolus.    Review Of Systems: Per HPI  Patient Active Problem List   Diagnosis Date Noted  . Toxic metabolic encephalopathy 11/28/2013  . Non-healing wound of amputation West 09/06/2013  . Atherosclerosis of native arteries of the extremities with gangrene 08/08/2013  . AKI (acute kidney injury) 07/09/2013  . Ischemic foot 07/05/2013  . Sepsis 07/05/2013  . Peripheral vascular disease 07/05/2013  . Metabolic acidosis, increased anion gap 07/05/2013  . Transaminitis 07/05/2013  . HYPERTENSION 09/04/2009   Past Medical History: Past Medical History  Diagnosis Date  . Hypertension     has been off meds for years.   . Peripheral vascular disease   . 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 Dementia     hx unreliablr pt confused   Past Surgical History: Past Surgical History  Procedure Laterality Date  . Amputation Right 07/07/2013  Procedure: AMPUTATION BELOW KNEE- RIGHT;  Surgeon: Pryor OchoaJames D Lawson, MD;  [facility];  Service: Vascular;  Laterality: Right;  . Amputation Right 09/06/2013    Procedure: RIGHT ABOVE KNEE AMPUTATION;  Surgeon: Pryor OchoaJames D Lawson, MD;  [facility];  Service: Vascular;  Laterality: Right;   Social History: History  Substance Use Topics  . Smoking status: Former Smoker -- 3.00 packs/day for 50 years    Types: Cigarettes    Quit  date: 05/11/2013  . Smokeless tobacco: Never Used     Comment: 07/05/2013 "quit smoking cigarettes the week before Christmas"  . Alcohol Use: Yes     Comment: 07/05/2013 "drinks 1 quart/day"/notes 4/05/27/2009; denies alcohol hx on 07/05/2013   Family History: History reviewed. No pertinent family history. Allergies and Medications: No Known Allergies No current facility-administered medications on file prior to encounter.   No current outpatient prescriptions on file prior to encounter.    Objective: BP 157/88  Pulse 107  Temp(Src) 97.2 F (36.2 C) (Rectal)  Resp 16  SpO2 100% Exam: General: Mild distress HEENT: [place]/AT, Left eye 4 mm, right eye 1 mm. There is an opacity of the left lense (chronic per daughter), MMM Neck: No JVD Cardiovascular: RRR, no murmurs appreciated  Respiratory: CTAB Abdomen: Soft/NT/ND/NABS Extremities: R BKA w/o erythema or open ulcerations Skin: No breakdown or rashes Neuro: Awake, not alert or oriented to person time or place, unable to follow commands. No neurologic focal deficits appreciated from minimal exam   Labs and Imaging: CBC BMET   Recent Labs Lab 11/28/13 1226  WBC 8.0  HGB 12.9*  HCT 37.5*  PLT 178    Recent Labs Lab 11/28/13 1226  NA 141  K 4.4  CL 102  CO2 26  BUN 17  CREATININE 0.88  GLUCOSE 123*  CALCIUM 10.1     CT head 11/28/2013 - IMPRESSION:  Remote infarcts and small vessel disease type changes without CT  evidence of large acute infarct.  Atrophy without hydrocephalus.  Vascular calcifications.  Paranasal sinus mucosal thickening/ partial opacification as noted  above.  CXR 11/28/2013 - IMPRESSION:  There is stable enlargement of the cardiac silhouette without  significant pulmonary vascular congestion nor evidence of pneumonia  EKG - TWI lateral leads, new from last EKG in 4/15  Briscoe DeutscherBryan R Kaislee Chao, DO 11/28/2013, 3:24 PM PGY-3, [place] FPTS Intern pager: [phone], text pages welcome

## 2013-11-28 NOTE — ED Notes (Signed)
Report given. Pt remains responsive to painful stimuli. Respirations easy non labored.

## 2013-11-28 NOTE — ED Notes (Signed)
Patient transported to CT 

## 2013-11-28 NOTE — ED Provider Notes (Signed)
Medical screening examination/treatment/procedure(s) were conducted as a shared visit with non-physician practitioner(s) and myself.  I personally evaluated the patient during the encounter     Nelia Shiobert L Sabreen Kitchen, MD 11/28/13 [phone]

## 2013-11-28 NOTE — Progress Notes (Addendum)
Patient trasfered from ED to [phone] via stretcher; alert and disoriented, unable to communicate with staff; no signs or symptoms of pain or other distress ; IV saline locked in LH; skin intact; dry with discoloration on left lower leg and right AKA. Patient on high fall risk; bed alarm on. Will continue to monitor the patient.

## 2013-11-28 NOTE — H&P (Addendum)
FMTS Attending Note  I personally saw and evaluated the patient. The plan of care was discussed with the resident team. I agree with the assessment and plan as documented by the resident.   73 y/o male with PMH HTN, PAD, Dementia, and right AKA who resides in a nursing home brought in by EMS for altered mental status. No family is present during my examination. Patient is unable to provide history. Please refer to resident note for HPI.  Vitals: reviewed GEN: AAM, encephalopathic, arousable however does not communicate HEENT: normocephalic, left pupil dilated with cataract, right eye reactive to light, no scleral icterus, nasal septum midline, no rhinorrhea, dry mucous membranes, neck supple, no anterior/posterior/supraclavicular lymphadenopathy Cardiac: RRR, S1 and S2 present, no murmurs, no heaves/thrills, no JVD Resp: CTAB anteriorly, normal effort Abd: soft, no tenderness, no rebound, no guarding, normal bowel sounds GU: foley catheter in place Ext: right BKA (no evidence of dehiscence), no edema, 2+ radial pulses bilaterally, left foot is warm Neuro: Patient is not oriented, moving all extremities, neuro exam limited by encephalopathy, no neck tenderness, negative Brudsinksi/Kernig Skin: no rashes, resident physician examined sacral area and there was not skin breakdown  Reviewed labwork, imaging, and EKG (T wave inversions V5/6 and borderline ST elevation V2 and V3)  Assessment and plan: 73 y/o male admitted with acute encephalopathy 1. Encephalopathy - unclear etiology at this time, workup as outlined in resident note negative thus far, awaiting blood and urine cultures, no focal neurologic deficits to suggest CVA 2. EKG changes - initial troponin negative, trend troponins and repeat EKG, if patient is having acute ischemia this could be contributing to encephalopathy 3. Elevated lactic acid - abdominal exam benign, may be caused by cardiac ischemia, will trend LA 4. HTN - currently  within acceptable limits, continue home regimen once patient able to take PO 5. PAD - no evidence of acute peripheral ischemia on exam, continue home medications once patient able to take PO  Donnella ShamKyle Calianna Kim MD ,

## 2013-11-29 ENCOUNTER — Inpatient Hospital Stay (HOSPITAL_COMMUNITY): Payer: Medicare Other

## 2013-11-29 DIAGNOSIS — G929 Unspecified toxic encephalopathy: Secondary | ICD-10-CM

## 2013-11-29 DIAGNOSIS — G92 Toxic encephalopathy: Secondary | ICD-10-CM

## 2013-11-29 DIAGNOSIS — R000 Tachycardia, unspecified: Secondary | ICD-10-CM

## 2013-11-29 LAB — BASIC METABOLIC PANEL
Anion gap: 13 (ref 5–15)
BUN: 12 mg/dL (ref 6–23)
CO2: 24 meq/L (ref 19–32)
Calcium: 9.8 mg/dL (ref 8.4–10.5)
Chloride: 103 mEq/L (ref 96–112)
Creatinine, Ser: 0.85 mg/dL (ref 0.50–1.35)
GFR calc Af Amer: >90 mL/min (ref >90)
GFR, EST NON AFRICAN AMERICAN: 83 mL/min — AB (ref >90)
Glucose, Bld: 107 mg/dL — ABNORMAL HIGH (ref 70–99)
POTASSIUM: 4.3 meq/L (ref 3.7–5.3)
SODIUM: 140 meq/L (ref 137–147)

## 2013-11-29 LAB — CBC WITH DIFFERENTIAL/PLATELET
Basophils Absolute: 0 10*3/uL (ref 0.0–0.1)
Basophils Relative: 0 % (ref 0–1)
EOS ABS: 0.1 10*3/uL (ref 0.0–0.7)
Eosinophils Relative: 1 % (ref 0–5)
HCT: 36.5 % — ABNORMAL LOW (ref 39.0–52.0)
Hemoglobin: 12.4 g/dL — ABNORMAL LOW (ref 13.0–17.0)
LYMPHS ABS: 2.2 10*3/uL (ref 0.7–4.0)
LYMPHS PCT: 23 % (ref 12–46)
MCH: 28.8 pg (ref 26.0–34.0)
MCHC: 34 g/dL (ref 30.0–36.0)
MCV: 84.7 fL (ref 78.0–100.0)
Monocytes Absolute: 0.5 10*3/uL (ref 0.1–1.0)
Monocytes Relative: 6 % (ref 3–12)
NEUTROS PCT: 70 % (ref 43–77)
Neutro Abs: 6.7 10*3/uL (ref 1.7–7.7)
PLATELETS: 184 10*3/uL (ref 150–400)
RBC: 4.31 MIL/uL (ref 4.22–5.81)
RDW: 13.8 % (ref 11.5–15.5)
WBC: 9.5 10*3/uL (ref 4.0–10.5)

## 2013-11-29 LAB — LACTIC ACID, PLASMA: Lactic Acid, Venous: 1.7 mmol/L (ref 0.5–2.2)

## 2013-11-29 LAB — URINE CULTURE
Colony Count: NO GROWTH
Culture: NO GROWTH
Special Requests: NORMAL

## 2013-11-29 LAB — GLUCOSE, CAPILLARY: Glucose-Capillary: 96 mg/dL (ref 70–99)

## 2013-11-29 LAB — HIV ANTIBODY (ROUTINE TESTING W REFLEX): HIV 1&2 Ab, 4th Generation: NONREACTIVE

## 2013-11-29 LAB — RPR

## 2013-11-29 MED ORDER — IOHEXOL 350 MG/ML SOLN
100.0000 mL | Freq: Once | INTRAVENOUS | Status: AC | PRN
  Administered 2013-11-29: 65 mL via INTRAVENOUS

## 2013-11-29 NOTE — Progress Notes (Signed)
Utilization review completed.  

## 2013-11-29 NOTE — Progress Notes (Addendum)
Family Medicine Teaching Service Daily Progress Note Intern Pager: [phone]  Patient name: Seth West Medical record number: [id number] Date of birth: 1941/05/01 Age: 73 y.o. Gender: male  Primary Care Provider: Pcp Not In System Consultants: None- will c/s neurology Code Status: FULL   Pt Overview and Major Events to Date:  7/7 Patient in normal state of health previously presenting with AMS  Assessment and Plan:  Seth West is a 73 y.o. male presenting with acute encephalopathy. PMH is significant for Right BKA, HTN, PVD.   Acute Toxic Metabolic Encephalopathy with lactic acidosis - UDS, CBC, CT head, CXR, UA, i stat troponin, ABG, CMP, alcohol, TSH, salicylate level, acetaminophen level, ammonia, BUN all without revealing cause. DDx is quite broad including centrally neurologic, toxic, ischemic.   - Admit to telemetry: Sinus tachy (HR 90s-130s) - Soft restraints as needed, haldol 1 mg PRN q 6 hrs for agitation (QTc 457 ms)   - f/u repeat EKG - Troponin neg x 2. - Blood and urine cx: NGTD - Repeat Lactic acid increased 2.68>3.1 despite IVFs.  - f/u HIV and RPR  - f/u EEG - Hold all sedating medications  - If no improvement in mentation, consider EEG and neurology consult   HTN  - Continue home medications   Peripheral Vascular Disease  - Continue home medications   FEN/GI: NPO, NS @ 100 cc/hr  Prophylaxis: SQ Lovenox   Disposition: Telemetry. If respiratory status declines consider SDU vs ICU   Subjective:  Unable to obtain a history from patient. Per nursing, patient will be very lethargic at times and then at other times will become more agitated, pulling at IVs.  Objective: Temp:  [97 F (36.1 C)-98.4 F (36.9 C)] 98.4 F (36.9 C) (07/08 0532) Pulse Rate:  [98-112] 112 (07/08 0532) Resp:  [15-29] 16 (07/08 0532) BP: (142-193)/(75-117) 157/92 mmHg (07/08 0532) SpO2:  [94 %-100 %] 96 % (07/07 2100) Physical Exam: General: No acute distress lying in  bed with eyes closed. Currently in soft restraints  HEENT: [place]/AT, Left eye 4 mm, right eye 1 mm. There is an opacity of the left lense (chronic per daughter), No pupillary response to light on my exam.  Dry mucous membranes Neck: No JVD  Cardiovascular: RRR, no murmurs appreciated  Respiratory: Lungs rhonchous over the anterior chest. Abdomen: Soft/NT/ND/NABS  Extremities: R BKA w/o erythema or open ulcerations  Skin: No breakdown or rashes  Neuro: Sleeping, Not alert or oriented to person time or place. Non-responsive to verbal stimuli. Unable to follow commands. Patient moves arms and legs with sternal chest rub and with eye exam.   Laboratory:  Recent Labs Lab 11/28/13 1226 11/29/13 0630  WBC 8.0 9.5  HGB 12.9* 12.4*  HCT 37.5* 36.5*  PLT 178 184    Recent Labs Lab 11/28/13 1226 11/29/13 0630  NA 141 140  K 4.4 4.3  CL 102 103  CO2 26 24  BUN 17 12  CREATININE 0.88 0.85  CALCIUM 10.1 9.8  PROT 8.4*  --   BILITOT 0.5  --   ALKPHOS 102  --   ALT 18  --   AST 21  --   GLUCOSE 123* 107*    Imaging/Diagnostic Tests: CT head 11/28/2013 - IMPRESSION:  Remote infarcts and small vessel disease type changes without CT  evidence of large acute infarct.  Atrophy without hydrocephalus.  Vascular calcifications.  Paranasal sinus mucosal thickening/ partial opacification as noted  above.   CXR 11/28/2013 - IMPRESSION:  There is  stable enlargement of the cardiac silhouette without  significant pulmonary vascular congestion nor evidence of pneumonia  EKG - TWI lateral leads, new from last EKG in 4/15   Joanna Puffrystal S Dorsey, MD 11/29/2013, 9:28 AM PGY-1, [facility] FPTS Intern pager: [phone], text pages welcome

## 2013-11-29 NOTE — Clinical Social Work Psychosocial (Signed)
Clinical Social Work Department BRIEF PSYCHOSOCIAL ASSESSMENT 11/29/2013  Patient:  Seth West,Seth West     Account Number:  [id number]     Admit date:  11/28/2013  Clinical Social Worker:  Lavell LusterAMPBELL,Chae Oommen BRYANT, LCSWA  Date/Time:  11/29/2013 01:29 PM  Referred by:  Physician  Date Referred:  11/29/2013 Referred for  SNF Placement   Other Referral:   Interview type:  Family Other interview type:   Patient's daughter interviewed by phone to complete assessment.    PSYCHOSOCIAL DATA Living Status:  FACILITY Admitted from facility:  [facility] Level of care:  Skilled Nursing Facility Primary support name:  Seth West Primary support relationship to patient:  CHILD, ADULT Degree of support available:   Support is good.    CURRENT CONCERNS Current Concerns  Post-Acute Placement   Other Concerns:    SOCIAL WORK ASSESSMENT / PLAN CSW spoke with patient's daughter by phone as patient is currently unable to contribute to assessment. [facility] and the patient's daughter Seth West would like to look at other options for SNF placement. Per Seth West [facility]. Seth West works for [employer], but is apprehensive to have him moved to [employer]. [facility] she found him covered in vomit on the day of admission to the hospital. She was very disappointed by this. CSW explained SNF search/placement process and answered [name]'s questions. CSW will follow up with bed offers.   Assessment/plan status:  Psychosocial Support/Ongoing Assessment of Needs Other assessment/ plan:   Complete Fl2, Fax, PASRR   Information/referral to community resources:   CSW contact information and SNF list given to patient's daughter.    PATIENT'S/FAMILY'S RESPONSE TO PLAN OF CARE: Patient's daughter Seth West plans for the patient to DC to SNF, but is [facility]. CSW will assist as appropriate.       Roddie McBryant Phynix Horton MSW, [place], [place], [phone]

## 2013-11-29 NOTE — Procedures (Signed)
History: 73 yo M with AMS  Sedation: None  Technique: This is a 17 channel routine scalp EEG performed at the bedside with bipolar and monopolar montages arranged in accordance to the international 10/20 system of electrode placement. One channel was dedicated to EKG recording.    Background: There is a well defined posterior dominant rhythm of 8.5 Hz that attenuates with eye opening. There is mild generalized irregular delta activity that appears at times. There is a suggestion of asymetry in the PDR, though not enough to be certain, with a predominance on the right.   Photic stimulation: Physiologic driving is not performed.   EEG Abnormalities: 1) Mild generalized irregular delta activity.   Clinical Interpretation: This EEG is consistent with a mild generalized non-specific cerebral dysfunction(encephalopathy). There was a mild suggestion of left hemispheric dysfunction, but this was not definite on this study. There was no seizure or seizure predisposition recorded on this study.   Ritta SlotMcNeill Kathlene Yano, MD [facility] [phone]  If 7pm- 7am, please page neurology on call as listed in AMION.

## 2013-11-29 NOTE — Clinical Social Work Placement (Signed)
Clinical Social Work Department CLINICAL SOCIAL WORK PLACEMENT NOTE 11/29/2013  Patient:  Seth West,Seth West  Account Number:  [id number] Admit date:  11/28/2013  Clinical Social Worker:  Cherre BlancJOSEPH BRYANT Aalyssa Elderkin, [place]WA  Date/time:  11/29/2013 04:51 PM  Clinical Social Work is seeking post-discharge placement for this patient at the following level of care:   SKILLED NURSING   (*CSW will update this form in Epic as items are completed)   11/29/2013  Patient/family provided with [facility] Department of Clinical Social Work's list of facilities offering this level of care within the geographic area requested by the patient (or if unable, by the patient's family).  11/29/2013  Patient/family informed of their freedom to choose among providers that offer the needed level of care, that participate in Medicare, Medicaid or managed care program needed by the patient, have an available bed and are willing to accept the patient.  11/29/2013  Patient/family informed of [facility]' ownership interest in [facility], as well as of the fact that they are under no obligation to receive care at this facility.  PASARR submitted to EDS on  PASARR number received on   FL2 transmitted to all facilities in geographic area requested by pt/family on  11/29/2013 FL2 transmitted to all facilities within larger geographic area on   Patient informed that his/her managed care company has contracts with or will negotiate with  certain facilities, including the following:     Patient/family informed of bed offers received:   Patient chooses bed at  Physician recommends and patient chooses bed at    Patient to be transferred to  on   Patient to be transferred to facility by  Patient and family notified of transfer on  Name of family member notified:    The following physician request were entered in Epic:   Additional Comments:    Roddie McBryant Yusuke Beza MSW, [place]CSWA, [place], [phone]

## 2013-11-29 NOTE — Progress Notes (Signed)
EEG completed; results pending.    

## 2013-11-29 NOTE — Progress Notes (Signed)
FMTS Attending Note  I personally saw and evaluated the patient. The plan of care was discussed with the resident team. I agree with the assessment and plan as documented by the resident.   Patient remains encephalopathic, arousable with painful stimuli  Assessment and plan: 73 y/o male admitted with acute encephalopathy  1. Encephalopathy - unclear etiology at this time, workup as outlined in resident note negative thus far, awaiting blood and urine cultures, no focal neurologic deficits to suggest CVA, will check EEG to rule out status epilepticus 2. EKG changes - troponins negative, repeat EKG showed sinus tachycardia with resolution of ST change in anterior leads and no Twave inversions in V5/V6 as noted yesterday, these findings suggest against acute ischemia  3. Elevated lactic acid - trending downward, unclear etiology, abdominal exam remains benign 4. HTN - currently within acceptable limits, continue home regimen and PRN Hydralazine 5. PAD - no evidence of acute peripheral ischemia on exam, continue home medications  6. Tachycardia with hypoxia overnight - CTA chest obtained and no acute PE or chest etiology, tachycardia and hypoxia have resolved  Donnella ShamKyle Sidney Kann MD

## 2013-11-30 ENCOUNTER — Encounter (HOSPITAL_COMMUNITY): Payer: Self-pay | Admitting: Neurology

## 2013-11-30 ENCOUNTER — Inpatient Hospital Stay (HOSPITAL_COMMUNITY): Payer: Medicare Other

## 2013-11-30 DIAGNOSIS — R4020 Unspecified coma: Secondary | ICD-10-CM

## 2013-11-30 DIAGNOSIS — J9600 Acute respiratory failure, unspecified whether with hypoxia or hypercapnia: Secondary | ICD-10-CM | POA: Diagnosis present

## 2013-11-30 DIAGNOSIS — I639 Cerebral infarction, unspecified: Secondary | ICD-10-CM | POA: Diagnosis present

## 2013-11-30 DIAGNOSIS — I6350 Cerebral infarction due to unspecified occlusion or stenosis of unspecified cerebral artery: Principal | ICD-10-CM

## 2013-11-30 DIAGNOSIS — R4182 Altered mental status, unspecified: Secondary | ICD-10-CM | POA: Diagnosis present

## 2013-11-30 LAB — CBC WITH DIFFERENTIAL/PLATELET
BASOS PCT: 0 % (ref 0–1)
Basophils Absolute: 0 10*3/uL (ref 0.0–0.1)
EOS ABS: 0.1 10*3/uL (ref 0.0–0.7)
Eosinophils Relative: 1 % (ref 0–5)
HEMATOCRIT: 36.1 % — AB (ref 39.0–52.0)
Hemoglobin: 12.2 g/dL — ABNORMAL LOW (ref 13.0–17.0)
Lymphocytes Relative: 22 % (ref 12–46)
Lymphs Abs: 2.2 10*3/uL (ref 0.7–4.0)
MCH: 28.3 pg (ref 26.0–34.0)
MCHC: 33.8 g/dL (ref 30.0–36.0)
MCV: 83.8 fL (ref 78.0–100.0)
MONO ABS: 0.7 10*3/uL (ref 0.1–1.0)
Monocytes Relative: 7 % (ref 3–12)
Neutro Abs: 6.9 10*3/uL (ref 1.7–7.7)
Neutrophils Relative %: 70 % (ref 43–77)
PLATELETS: 198 10*3/uL (ref 150–400)
RBC: 4.31 MIL/uL (ref 4.22–5.81)
RDW: 13.9 % (ref 11.5–15.5)
WBC: 9.9 10*3/uL (ref 4.0–10.5)

## 2013-11-30 LAB — BLOOD GAS, ARTERIAL
ACID-BASE DEFICIT: 2.6 mmol/L — AB (ref 0.0–2.0)
BICARBONATE: 20.8 meq/L (ref 20.0–24.0)
DRAWN BY: 34767
FIO2: 0.4 %
LHR: 14 {breaths}/min
MECHVT: 620 mL
O2 SAT: 98.8 %
PCO2 ART: 30.7 mmHg — AB (ref 35.0–45.0)
PEEP: 5 cmH2O
Patient temperature: 98.6
TCO2: 21.7 mmol/L (ref 0–100)
pH, Arterial: 7.446 (ref 7.350–7.450)
pO2, Arterial: 134 mmHg — ABNORMAL HIGH (ref 80.0–100.0)

## 2013-11-30 LAB — BASIC METABOLIC PANEL
Anion gap: 15 (ref 5–15)
BUN: 13 mg/dL (ref 6–23)
CALCIUM: 9.8 mg/dL (ref 8.4–10.5)
CO2: 22 mEq/L (ref 19–32)
CREATININE: 0.88 mg/dL (ref 0.50–1.35)
Chloride: 101 mEq/L (ref 96–112)
GFR calc Af Amer: >90 mL/min (ref >90)
GFR, EST NON AFRICAN AMERICAN: 82 mL/min — AB (ref >90)
Glucose, Bld: 87 mg/dL (ref 70–99)
Potassium: 3.8 mEq/L (ref 3.7–5.3)
SODIUM: 138 meq/L (ref 137–147)

## 2013-11-30 LAB — GLUCOSE, CAPILLARY
GLUCOSE-CAPILLARY: 62 mg/dL — AB (ref 70–99)
Glucose-Capillary: 96 mg/dL (ref 70–99)
Glucose-Capillary: 88 mg/dL (ref 70–99)
Glucose-Capillary: 113 mg/dL — ABNORMAL HIGH (ref 70–99)

## 2013-11-30 LAB — MAGNESIUM: MAGNESIUM: 1.5 mg/dL (ref 1.5–2.5)

## 2013-11-30 LAB — LACTIC ACID, PLASMA: Lactic Acid, Venous: 1 mmol/L (ref 0.5–2.2)

## 2013-11-30 LAB — PROCALCITONIN: Procalcitonin: <0.1 ng/mL

## 2013-11-30 MED ORDER — GADOBENATE DIMEGLUMINE 529 MG/ML IV SOLN
15.0000 mL | Freq: Once | INTRAVENOUS | Status: AC | PRN
  Administered 2013-11-30: 15 mL via INTRAVENOUS

## 2013-11-30 MED ORDER — BIOTENE DRY MOUTH MT LIQD
15.0000 mL | Freq: Four times a day (QID) | OROMUCOSAL | Status: DC
  Administered 2013-12-01 (×3): 15 mL via OROMUCOSAL

## 2013-11-30 MED ORDER — FENTANYL CITRATE 0.05 MG/ML IJ SOLN
INTRAMUSCULAR | Status: AC
  Administered 2013-11-30: 100 ug
  Filled 2013-11-30: qty 2

## 2013-11-30 MED ORDER — MIDAZOLAM HCL 2 MG/2ML IJ SOLN
INTRAMUSCULAR | Status: AC
  Administered 2013-11-30: 2 mg
  Filled 2013-11-30: qty 2

## 2013-11-30 MED ORDER — PROPOFOL 10 MG/ML IV EMUL: 0.0000 ug/kg/min | INTRAVENOUS | Status: DC

## 2013-11-30 MED ORDER — STROKE: EARLY STAGES OF RECOVERY BOOK
Freq: Once | Status: AC
  Administered 2013-12-01
  Filled 2013-11-30: qty 1

## 2013-11-30 MED ORDER — DEXTROSE 50 % IV SOLN
INTRAVENOUS | Status: AC
  Filled 2013-11-30: qty 50

## 2013-11-30 MED ORDER — ETOMIDATE 2 MG/ML IV SOLN
INTRAVENOUS | Status: AC
  Administered 2013-11-30: 10 mg
  Filled 2013-11-30: qty 10

## 2013-11-30 MED ORDER — SODIUM CHLORIDE 0.9 % IV BOLUS (SEPSIS)
250.0000 mL | Freq: Once | INTRAVENOUS | Status: AC
  Administered 2013-11-30: 250 mL via INTRAVENOUS

## 2013-11-30 MED ORDER — INSULIN ASPART 100 UNIT/ML ~~LOC~~ SOLN: 2.0000 [IU] | SUBCUTANEOUS | Status: DC

## 2013-11-30 MED ORDER — PANTOPRAZOLE SODIUM 40 MG IV SOLR
40.0000 mg | INTRAVENOUS | Status: DC
  Administered 2013-11-30: 40 mg via INTRAVENOUS
  Filled 2013-11-30 (×2): qty 40

## 2013-11-30 MED ORDER — PNEUMOCOCCAL VAC POLYVALENT 25 MCG/0.5ML IJ INJ
0.5000 mL | INJECTION | INTRAMUSCULAR | Status: DC
  Filled 2013-11-30: qty 0.5

## 2013-11-30 MED ORDER — ASPIRIN 300 MG RE SUPP
300.0000 mg | Freq: Once | RECTAL | Status: AC
  Administered 2013-11-30: 300 mg via RECTAL
  Filled 2013-11-30: qty 1

## 2013-11-30 MED ORDER — CHLORHEXIDINE GLUCONATE 0.12 % MT SOLN
15.0000 mL | Freq: Two times a day (BID) | OROMUCOSAL | Status: DC
  Administered 2013-12-01: 15 mL via OROMUCOSAL
  Filled 2013-11-30: qty 15

## 2013-11-30 MED ORDER — DEXTROSE 50 % IV SOLN
25.0000 mL | Freq: Once | INTRAVENOUS | Status: AC | PRN
  Administered 2013-11-30: 25 mL via INTRAVENOUS

## 2013-11-30 NOTE — Progress Notes (Deleted)
eLink Physician-Brief Progress Note Patient Name: Seth West DOB: 09-13-1940 MRN: [id number]  Date of Service  11/30/2013   HPI/Events of Note   RASS +4 almost out of bed. Prn fentanyl and versed not enough  eICU Interventions  Restart diprivan (pre-empt hypotension with 250cc fluid bolus)   Intervention Category Major Interventions: Delirium, psychosis, severe agitation - evaluation and management  Vibha Ferdig 11/30/2013, 9:39 PM

## 2013-11-30 NOTE — Progress Notes (Addendum)
FMTS Attending Note  I personally saw and evaluated the patient at 0900. The plan of care was discussed with the resident team. I agree with the assessment and plan as documented by the resident.   Patient still only arousable to painful stimuli.   Assessment and plan: 73 y/o male admitted with acute encephalopathy  1. Encephalopathy - workup negative thus far, EEG showed no epileptiform activity (only non-specific cerebral dysfunction), will check MRI brain and consult Neurology 2. EKG changes - initial EKG changes resolved and troponins negative, cardiology now consulted for possible Torsades on Telemetry 3. Elevated lactic acid - trending downward, unclear etiology, abdominal exam remains benign  4. HTN - currently within acceptable limits, continue home regimen and PRN Hydralazine  5. PAD - no evidence of acute peripheral ischemia on exam, continue home medications  6. Tachycardia with hypoxia overnight - CTA chest obtained and no acute PE or chest etiology, patient still intermittently tachycardic 7. Possible Torsades - patient had possible Torsades on telemetry overnight, resident on call not notified, currently in NSR, will recheck EGK and consult cardiology, eliminated all QT prolonging medications from current regimen  Donnella ShamKyle Alline Pio MD

## 2013-11-30 NOTE — Progress Notes (Signed)
Report for transfer called into [facility].

## 2013-11-30 NOTE — Progress Notes (Signed)
Pt was not responding to command, non verbal, withdraws only to pain, with rales/crackling lung sounds. Oral suction done. Aspirin suppository given per order.Critical MD had seen pt at bedside and ordered to transfer pt to 3112. Daughter was notified. Gave report to RN in 3100.

## 2013-11-30 NOTE — Progress Notes (Signed)
Family Medicine Teaching Service Daily Progress Note Intern Pager: [phone]  Patient name: Seth Cuminsathaniel Liss Medical record number: [id number] Date of birth: Feb 13, 1941 Age: 73 y.o. Gender: male  Primary Care Provider: Pcp Not In System Consultants: None- will c/s neurology Code Status: FULL   Pt Overview and Major Events to Date:  7/7 Patient in normal state of health previously presenting with AMS. Initial workup negative 7/8 EEG non-revealing. RPR negative   Assessment and Plan:  Seth West is a 73 y.o. male presenting with acute encephalopathy. PMH is significant for Right BKA, HTN, PVD.   Acute Toxic Metabolic Encephalopathy with lactic acidosis - UDS, CBC, CT head, CXR, UA, i stat troponin, ABG, CMP, alcohol, TSH, salicylate level, acetaminophen level, ammonia, BUN all without revealing cause. DDx is quite broad including centrally neurologic, toxic, ischemic.   - Admit to telemetry: Sinus tachy (HR 90s-110s) - Soft restraints as needed, haldol 1 mg PRN q 6 hrs for agitation (QTc 457 ms) - has not needed haldol - Troponin neg x 2. - Blood and urine cx: NGTD - Lactic acid2.68>3.1 > 1.7, procalcitonin wnl - HIV and RPR  - EEG with mild generalized non-specific cerebral dysfunction(encephalopathy) with a mild suggestion of left hemispheric dysfunction. - Hold all sedating medications  - Consider MRI - Consult neurology  HTN  - BP has been slightly elevated: 155/81 currently - There's a possibility that pain is contributing to this, however he has been lethargic and non-combative during his hospitalization.  - Continue home medications   Peripheral Vascular Disease  - Continue home medications   FEN/GI: NPO, NS @ 125 cc/hr  Prophylaxis: SQ Lovenox   Disposition: Telemetry.   Subjective:  Unable to obtain a history from patient. Per nursing, patient was lethargic. Good UOP. Does not appear to be in pain.  Objective: Temp:  [98.6 F (37 C)-100.3 F (37.9 C)] 100  F (37.8 C) (07/09 0534) Pulse Rate:  [90-119] 112 (07/09 0534) Resp:  [16-18] 18 (07/09 0534) BP: (155-173)/(81-110) 155/81 mmHg (07/09 0534) SpO2:  [97 %-98 %] 98 % (07/09 0534) Physical Exam: General: No acute distress lying in bed with eyes closed. Currently in soft restraints  HEENT: [place]/AT, Left eye 4 mm, right eye 1 mm. There is an opacity of the left lense (chronic per daughter), No pupillary response to light on my exam.  Dry mucous membranes Neck: No JVD  Cardiovascular: RRR, no murmurs appreciated  Respiratory: Lungs rhonchous over the anterior chest. Abdomen: Soft/NT/ND/NABS  Extremities: R BKA w/o erythema or open ulcerations  Skin: No breakdown or rashes  Neuro: Sleeping, Not alert or oriented to person time or place. Non-responsive to verbal stimuli. Unable to follow commands. Patient moves arms and legs with sternal chest rub and with eye exam.   Laboratory:  Recent Labs Lab 11/28/13 1226 11/29/13 0630 11/30/13 0605  WBC 8.0 9.5 9.9  HGB 12.9* 12.4* 12.2*  HCT 37.5* 36.5* 36.1*  PLT 178 184 198    Recent Labs Lab 11/28/13 1226 11/29/13 0630  NA 141 140  K 4.4 4.3  CL 102 103  CO2 26 24  BUN 17 12  CREATININE 0.88 0.85  CALCIUM 10.1 9.8  PROT 8.4*  --   BILITOT 0.5  --   ALKPHOS 102  --   ALT 18  --   AST 21  --   GLUCOSE 123* 107*   Troponins neg x 2 Lactic acid 2.68> 3.1 > 1.7 Procalcitonin <0.10 TSH 0.991 Ammonia 17 UDS neg Salicylate level <  2.0 Acetaminophen level <15.0 RPR: nonreactive HIV: nonreactive  Blood and urine cx NGTD   Imaging/Diagnostic Tests: CT head 11/28/2013 - IMPRESSION:  Remote infarcts and small vessel disease type changes without CT  evidence of large acute infarct.  Atrophy without hydrocephalus.  Vascular calcifications.  Paranasal sinus mucosal thickening/ partial opacification as noted  above.   CXR 11/28/2013 - IMPRESSION:  There is stable enlargement of the cardiac silhouette without  significant pulmonary  vascular congestion nor evidence of pneumonia  EEG: This EEG is consistent with a mild generalized non-specific cerebral dysfunction(encephalopathy). There was a mild suggestion of left hemispheric dysfunction, but this was not definite on this study. There was no seizure or seizure predisposition recorded on this study.    Joanna Puff, MD 11/30/2013, 7:14 AM PGY-1, [facility] Family Medicine FPTS Intern pager: [phone], text pages welcome

## 2013-11-30 NOTE — Consult Note (Signed)
PULMONARY / CRITICAL CARE MEDICINE   Name: Seth West MRN: [id number] DOB: 03-Feb-1941    ADMISSION DATE:  11/28/2013 CONSULTATION DATE:  11/30/2013  REFERRING MD :  FPTS PRIMARY SERVICE: FPTS  CHIEF COMPLAINT:  Respiratory failure and CVA  BRIEF PATIENT DESCRIPTION: 73 year old male with PMH of mild dementia and HTN, on 7/7 patient was noted to be very confused when daughter went to see him and give him his BP medications.  Patient [facility] under [facility].  On 7/9 MRI was performed that showed multiple infracts throughout the brain (see MRI).  FPTS was concerned that patient is not protecting his airway and PCCM was called on consultation.  Patient is full code.  SIGNIFICANT EVENTS / STUDIES:  7/7 admission for AMS. 7/9 MRI with diffuse infarcts.  LINES / TUBES: PIV  CULTURES: Urine 7/7>>>NTD Sputum 7/7>>>NTD MRSA swab 7/7>>>Neg.  ANTIBIOTICS: None  PAST MEDICAL HISTORY :  Past Medical History  Diagnosis Date  . Hypertension     has been off meds for years.   . Peripheral vascular disease   . 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 Dementia     hx unreliablr pt confused   Past Surgical History  Procedure Laterality Date  . Amputation Right 07/07/2013    Procedure: AMPUTATION BELOW KNEE- RIGHT;  Surgeon: Pryor OchoaJames D Lawson, MD;  [facility];  Service: Vascular;  Laterality: Right;  . Amputation Right 09/06/2013    Procedure: RIGHT ABOVE KNEE AMPUTATION;  Surgeon: Pryor OchoaJames D Lawson, MD;  [facility];  Service: Vascular;  Laterality: Right;   Prior to Admission medications   Medication Sig Start Date End Date Taking? Authorizing Provider  amLODipine (NORVASC) 5 MG tablet Take 5 mg by mouth 2 (two) times daily.   Yes Historical Provider, MD  metoprolol tartrate (LOPRESSOR) 25 MG tablet Take 25 mg by mouth 2 (two) times daily.   Yes Historical Provider, MD  oxyCODONE (OXY IR/ROXICODONE) 5 MG immediate release tablet Take 5 mg by mouth every 4 (four)  hours as needed for severe pain.   Yes Historical Provider, MD  Vitamin D, Ergocalciferol, (DRISDOL) 50000 UNITS CAPS capsule Take 50,000 Units by mouth every 7 (seven) days.   Yes Historical Provider, MD   No Known Allergies  FAMILY HISTORY:  Family History  Problem Relation Age of Onset  . Hypertension Mother   . Hypertension Father    SOCIAL HISTORY:  reports that he quit smoking about 6 months ago. His smoking use included Cigarettes. He has a 150 pack-year smoking history. He has never used smokeless tobacco. He reports that he drinks alcohol. He reports that he does not use illicit drugs.  REVIEW OF SYSTEMS:  Unattainable, patient is incoherent and is encephalopathic.  SUBJECTIVE:   VITAL SIGNS: Temp:  [98.7 F (37.1 C)-100.3 F (37.9 C)] 98.8 F (37.1 C) (07/09 1734) Pulse Rate:  [107-119] 107 (07/09 1734) Resp:  [16-20] 20 (07/09 1734) BP: (155-173)/(81-110) 167/86 mmHg (07/09 1734) SpO2:  [97 %-100 %] 100 % (07/09 1734) Weight:  [153 lb 14.4 oz (69.809 kg)] 153 lb 14.4 oz (69.809 kg) (07/09 1632) HEMODYNAMICS:   VENTILATOR SETTINGS:   INTAKE / OUTPUT: Intake/Output     07/08 0701 - 07/09 0700 07/09 0701 - 07/10 0700   I.V. (mL/kg)     Total Intake(mL/kg)     Urine (mL/kg/hr) 1600 475 (0.6)   Total Output 1600 475   Net -1600 -475  Stool Occurrence 2 x 2 x     PHYSICAL EXAMINATION: General:  Chronically ill appearing male, completely unresponsive. Neuro:  Unresponsive, does not withdraw to pain, breathing and gurgling.  No gag reflex. HEENT:  [place]/AT, PERRL, EOM-spontaneous, normal dolls eye. Cardiovascular:  RRR, Nl S1/S2, -M/R/G. Lungs:  Transmitted upper airway sounds. Abdomen:  Soft, NT, ND and +BS. Musculoskeletal:  -edema and -tenderness.  R AKA. Skin:  Intact.  LABS:  CBC  Recent Labs Lab 11/28/13 1226 11/29/13 0630 11/30/13 0605  WBC 8.0 9.5 9.9  HGB 12.9* 12.4* 12.2*  HCT 37.5* 36.5* 36.1*  PLT 178 184 198   Coag's No results  found for this basename: APTT, INR,  in the last 168 hours BMET  Recent Labs Lab 11/28/13 1226 11/29/13 0630 11/30/13 0605  NA 141 140 138  K 4.4 4.3 3.8  CL 102 103 101  CO2 26 24 22   BUN 17 12 13   CREATININE 0.88 0.85 0.88  GLUCOSE 123* 107* 87   Electrolytes  Recent Labs Lab 11/28/13 1226 11/29/13 0630 11/30/13 0605  CALCIUM 10.1 9.8 9.8  MG  --   --  1.5   Sepsis Markers  Recent Labs Lab 11/28/13 1358 11/28/13 1843 11/29/13 0630 11/30/13 0605 11/30/13 1005  LATICACIDVEN 3.1*  --  1.7  --  1.0  PROCALCITON  --  <0.10  --  <0.10  --    ABG  Recent Labs Lab 11/28/13 1334  PHART 7.416  PCO2ART 39.4  PO2ART 68.0*   Liver Enzymes  Recent Labs Lab 11/28/13 1226  AST 21  ALT 18  ALKPHOS 102  BILITOT 0.5  ALBUMIN 3.9   Cardiac Enzymes  Recent Labs Lab 11/28/13 1843  TROPONINI <0.30   Glucose  Recent Labs Lab 11/28/13 1256 11/30/13 0027 11/30/13 0358  GLUCAP 96 88 96    Imaging Ct Angio Chest Pe W/cm &/or Wo Cm  11/29/2013   CLINICAL DATA:  Hypoxemia.  EXAM: CT ANGIOGRAPHY CHEST WITH CONTRAST  TECHNIQUE: Multidetector CT imaging of the chest was performed using the standard protocol during bolus administration of intravenous contrast. Multiplanar CT image reconstructions and MIPs were obtained to evaluate the vascular anatomy.  CONTRAST:  65mL OMNIPAQUE IOHEXOL 350 MG/ML SOLN  COMPARISON:  Chest x-ray dated 11/28/2013  FINDINGS: There are no pulmonary emboli, infiltrates, or effusions. Heart size is normal. Coronary artery stent is noted. Small hiatal hernia. No significant osseous abnormalities.  Common bile duct is at the upper limits of normal in size at 7 mm. No dilated intrahepatic ducts.  Review of the MIP images confirms the above findings.  IMPRESSION: No pulmonary emboli or other acute abnormalities. Small hiatal hernia.   Electronically Signed   By: Geanie Cooley M.D.   On: 11/29/2013 13:38   [name] Head Wo Contrast  11/30/2013    CLINICAL DATA:  Diabetic hypertensive patient presenting with altered mental status.  EXAM: MRI HEAD WITHOUT CONTRAST  MRA HEAD WITHOUT CONTRAST  TECHNIQUE: Multiplanar, multiecho pulse sequences of the brain and surrounding structures were obtained without intravenous contrast. Angiographic images of the head were obtained using MRA technique without contrast.  COMPARISON:  11/28/2013 CT.  No comparison MR.  FINDINGS: MRI HEAD FINDINGS  Acute bilateral mid to superior cerebellar infarcts greater on the left. Acute pontine infarcts greater on the right. Acute medial left temporal lobe -occipital lobe infarct. Acute small left thalamic infarcts.  Remote infarct left lenticular nucleus/ caudate. Remote small cerebellar infarcts. Small vessel disease type changes.  No intracranial hemorrhage.  Atrophy without hydrocephalus.  No intracranial mass lesion noted on this unenhanced exam.  Abnormal appearance of the posterior circulation.  Please see below.  Paranasal sinus mucosal thickening/ partial opacification.  MRA HEAD FINDINGS  Lack of flow in the distal basilar artery, superior cerebral arteries, portions of the posterior cerebral arteries and anterior inferior cerebellar arteries.  Narrowing of the vertebral arteries greater on the left.  Poor delineation of posterior inferior cerebellar arteries.  Mild narrowing and irregularity of the cavernous segment and supraclinoid segment of the internal carotid arteries bilaterally.  Mild narrowing and irregularity of the M1 segment of the middle cerebral artery bilaterally greater on the right. Mild narrowing of the middle cerebral artery bifurcation greater on right.  Mild narrowing and irregularity middle cerebral artery and anterior cerebral artery branches bilaterally.  Ectatic internal carotid artery distal vertical cervical segment more notable on the right.  No aneurysm noted.  IMPRESSION: MRI HEAD :  Acute bilateral mid to superior cerebellar infarcts greater on  the left. Acute pontine infarcts greater on the right. Acute medial left temporal lobe -occipital lobe infarct. Acute small left thalamic infarcts.  Remote infarct left lenticular nucleus/ caudate. Remote small cerebellar infarcts. Small vessel disease type changes.  No intracranial hemorrhage.  Atrophy without hydrocephalus.  Abnormal appearance of the posterior circulation.  Please see below.  Paranasal sinus mucosal thickening/ partial opacification.  MRA HEAD :  Lack of flow in the distal basilar artery, superior cerebral arteries, portions of the posterior cerebral arteries and anterior inferior cerebellar arteries.  Narrowing of the vertebral arteries greater on the left.  Poor delineation of posterior inferior cerebellar arteries.  Less notable atherosclerotic type changes anterior circulation as noted above.  These results were called by telephone at the time of interpretation on 11/30/2013 at 3:37 PM to Dr. Amada Jupiter , who verbally acknowledged these results.   Electronically Signed   By: Bridgett Larsson M.D.   On: 11/30/2013 16:00   Mr Brain Wo Contrast  11/30/2013   CLINICAL DATA:  Diabetic hypertensive patient presenting with altered mental status.  EXAM: MRI HEAD WITHOUT CONTRAST  MRA HEAD WITHOUT CONTRAST  TECHNIQUE: Multiplanar, multiecho pulse sequences of the brain and surrounding structures were obtained without intravenous contrast. Angiographic images of the head were obtained using MRA technique without contrast.  COMPARISON:  11/28/2013 CT.  No comparison MR.  FINDINGS: MRI HEAD FINDINGS  Acute bilateral mid to superior cerebellar infarcts greater on the left. Acute pontine infarcts greater on the right. Acute medial left temporal lobe -occipital lobe infarct. Acute small left thalamic infarcts.  Remote infarct left lenticular nucleus/ caudate. Remote small cerebellar infarcts. Small vessel disease type changes.  No intracranial hemorrhage.  Atrophy without hydrocephalus.  No intracranial mass  lesion noted on this unenhanced exam.  Abnormal appearance of the posterior circulation.  Please see below.  Paranasal sinus mucosal thickening/ partial opacification.  MRA HEAD FINDINGS  Lack of flow in the distal basilar artery, superior cerebral arteries, portions of the posterior cerebral arteries and anterior inferior cerebellar arteries.  Narrowing of the vertebral arteries greater on the left.  Poor delineation of posterior inferior cerebellar arteries.  Mild narrowing and irregularity of the cavernous segment and supraclinoid segment of the internal carotid arteries bilaterally.  Mild narrowing and irregularity of the M1 segment of the middle cerebral artery bilaterally greater on the right. Mild narrowing of the middle cerebral artery bifurcation greater on right.  Mild narrowing and irregularity middle cerebral artery and  anterior cerebral artery branches bilaterally.  Ectatic internal carotid artery distal vertical cervical segment more notable on the right.  No aneurysm noted.  IMPRESSION: MRI HEAD :  Acute bilateral mid to superior cerebellar infarcts greater on the left. Acute pontine infarcts greater on the right. Acute medial left temporal lobe -occipital lobe infarct. Acute small left thalamic infarcts.  Remote infarct left lenticular nucleus/ caudate. Remote small cerebellar infarcts. Small vessel disease type changes.  No intracranial hemorrhage.  Atrophy without hydrocephalus.  Abnormal appearance of the posterior circulation.  Please see below.  Paranasal sinus mucosal thickening/ partial opacification.  MRA HEAD :  Lack of flow in the distal basilar artery, superior cerebral arteries, portions of the posterior cerebral arteries and anterior inferior cerebellar arteries.  Narrowing of the vertebral arteries greater on the left.  Poor delineation of posterior inferior cerebellar arteries.  Less notable atherosclerotic type changes anterior circulation as noted above.  These results were called by  telephone at the time of interpretation on 11/30/2013 at 3:37 PM to Dr. Amada Jupiter , who verbally acknowledged these results.   Electronically Signed   By: Bridgett Larsson M.D.   On: 11/30/2013 16:00     CXR: WNL  ASSESSMENT / PLAN:  PULMONARY A: Acute respiratory failure due to inability to protect his airway. P:   - Transfer to ICU. - Intubate upon arrival.  CARDIOVASCULAR A: HTN by history.  Target BP per neuro. P:  - Hold all PO meds. - Target BP per neuro.  RENAL A:  No active issues. P:   - BMET in AM. - Replace electrolytes as indicated. - Maintenance fluid.  GASTROINTESTINAL A:  Unable to protect airway. P:   - Place NGT. - Consult nutrition in AM for TF.  HEMATOLOGIC A:  No active issues. P:  - Monitor with daily CBC.  INFECTIOUS A:  No evidence of active infection. P:   - Cultures negative, f/u. - Hold off abx.  ENDOCRINE A:  No active issues.   P:   - Monitor.  NEUROLOGIC A:  Multiple strokes, likely showering from a cardiac origin. P:   - Hold all sedation (patient is unresponsive). - Order 2D echo looking for source of infarct. - Neuro consult appreciated. - Carotid dopplers.  TODAY'S SUMMARY: Diffuse CVAs throughout the brain.  Unable to protect airway.  Family wishes for full support.  Will transfer to the ICU, intubate, mechanically ventilate, will place TLC as well.  Will need ongoing discussion with family to determine plan of care.  Further recommendations per neuro.  I have personally obtained a history, examined the patient, evaluated laboratory and imaging results, formulated the assessment and plan and placed orders.  CRITICAL CARE: The patient is critically ill with multiple organ systems failure and requires high complexity decision making for assessment and support, frequent evaluation and titration of therapies, application of advanced monitoring technologies and extensive interpretation of multiple databases. Critical Care Time  devoted to patient care services described in this note is 45 minutes.   Alyson Reedy, M.D. [facility] Pulmonary/Critical Care Medicine. Pager: [phone]. After hours pager: [phone].  11/30/2013, 6:58 PM

## 2013-11-30 NOTE — Progress Notes (Signed)
Patient transferred to room 4N10 at this time. Sleepy and lethargic at this time.

## 2013-11-30 NOTE — Procedures (Signed)
Central Venous Catheter Insertion Procedure Note Verda Cuminsathaniel Giannelli [id number] 1940-06-06  Procedure: Insertion of Central Venous Catheter Indications: Assessment of intravascular volume, Drug and/or fluid administration and Frequent blood sampling  Procedure Details Consent: Unable to obtain consent because of altered level of consciousness. Time Out: Verified patient identification, verified procedure, site/side was marked, verified correct patient position, special equipment/implants available, medications/allergies/relevent history reviewed, required imaging and test results available.  Performed  Maximum sterile technique was used including antiseptics, cap, gloves, gown, hand hygiene, mask and sheet. Skin prep: Chlorhexidine; local anesthetic administered A antimicrobial bonded/coated triple lumen catheter was placed in the right internal jugular vein using the Seldinger technique.  Evaluation Blood flow good Complications: No apparent complications Patient did tolerate procedure well. Chest X-ray ordered to verify placement.  CXR: pending.  U/S used in placement.  Meah Jiron 11/30/2013, 7:46 PM

## 2013-11-30 NOTE — Procedures (Signed)
Arterial Catheter Insertion Procedure Note Seth West [id number] [birth year]  Procedure: Insertion of Arterial Catheter  Indications: Blood pressure monitoring and Frequent blood sampling  Procedure Details Consent: Unable to obtain consent because of emergent medical necessity. Time Out: Verified patient identification, verified procedure, site/side was marked, verified correct patient position, special equipment/implants available, medications/allergies/relevent history reviewed, required imaging and test results available.  Performed  Maximum sterile technique was used including antiseptics, cap, gloves, gown, hand hygiene, mask and sheet. Skin prep: Chlorhexidine; local anesthetic administered 20 gauge catheter was inserted into right radial artery using the Seldinger technique.  Evaluation Blood flow good; BP tracing good. Complications: No apparent complications.   Seth West,Seth West 11/30/2013

## 2013-11-30 NOTE — Care Management Note (Signed)
  Page 1 of 1   12/03/2013     11:02:33 AM CARE MANAGEMENT NOTE 12/03/2013  Patient:  Seth West,Seth West   Account Number:  [id number]  Date Initiated:  11/30/2013  Documentation initiated by:  Letha CapeAYLOR,DEBORAH  Subjective/Objective Assessment:   dx toxic metabolic encephalopathy, cva, R BKA,  [facility]     Action/Plan:   Anticipated DC Date:  12/02/2013   Anticipated DC Plan:    In-house referral  Clinical Social Worker      DC Planning Services  CM consult      Choice offered to / List presented to:             [facility]  [facility] [place]   Status of service:  In process, will continue to follow Medicare Important Message given?   (If response is "NO", the following Medicare IM given date fields will be blank) Date Medicare IM given:   Medicare IM given by:   Date Additional Medicare IM given:   Additional Medicare IM given by:    Discharge Disposition:    Per UR Regulation:  Reviewed for med. necessity/level of care/duration of stay  If discussed at Long Length of Stay Meetings, dates discussed:    Comments:  11/24/2013 Referral for GIP . Spoke with patient's wife and daughter at bedside, both agreeable to in hospital hospice and [facility] [place]. Forrestine HimEva Davis aware. Ronny FlurryHeather Amulya Quintin RN BSN    11/30/13 1548 Letha Capeeborah Taylor RN, BSN [phone] [facility], CSW referral. Patient  has had several mini strokes.  Patient is obtunded.

## 2013-11-30 NOTE — Procedures (Signed)
Intubation Procedure Note Seth West [id number] 01/24/1941  Procedure: Intubation Indications: Airway protection and maintenance  Procedure Details Consent: Unable to obtain consent because of altered level of consciousness. Time Out: Verified patient identification, verified procedure, site/side was marked, verified correct patient position, special equipment/implants available, medications/allergies/relevent history reviewed, required imaging and test results available.  Performed  Maximum sterile technique was used including antiseptics, gloves, hand hygiene and mask.  MAC    Evaluation Hemodynamic Status: BP stable throughout; O2 sats: stable throughout Patient's Current Condition: stable Complications: No apparent complications Patient did tolerate procedure well. Chest X-ray ordered to verify placement.  CXR: pending.  Seth BoundYACOUB,[name] 11/30/2013

## 2013-11-30 NOTE — Consult Note (Signed)
NEURO HOSPITALIST CONSULT NOTE    Reason for Consult: Encehalopathy  HPI:                                                                                                                                         Majority of history obtained from chart.    Seth West is an 73 y.o. male with history of HTN, PVD and dementia.  Per pt's daughter states he was in his normal state of health (able to feed himself, dress himself, slightly demented but would know his name, time of year, location when in nursing home) up until yesterday. She found him in his vomit and not responsive at which point recommended ED evaluation. While in the ED he was able to recognize his daughter but since then he has not been vocal.  Currently he is non responsive to pain, is not moving extremities and showing ronchorus breath sounds.   RPR, HIV, Troponin, TSH, ammonia, UDS, UA all WNL EEG showed no epileptiform activity.   Past Medical History  Diagnosis Date  . Hypertension     has been off meds for years.   . Peripheral vascular disease   . 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 Dementia     hx unreliablr pt confused    Past Surgical History  Procedure Laterality Date  . Amputation Right 07/07/2013    Procedure: AMPUTATION BELOW KNEE- RIGHT;  Surgeon: Pryor Ochoa, MD;  Location: [facility] OR;  Service: Vascular;  Laterality: Right;  . Amputation Right 09/06/2013    Procedure: RIGHT ABOVE KNEE AMPUTATION;  Surgeon: Pryor Ochoa, MD;  Location: [facility] OR;  Service: Vascular;  Laterality: Right;    Family History  Problem Relation Age of Onset  . Hypertension Mother   . Hypertension Father      Social History:  reports that he quit smoking about 6 months ago. His smoking use included Cigarettes. He has a 150 pack-year smoking history. He has never used smokeless tobacco. He reports that he drinks alcohol. He reports that he does not use illicit drugs.  No Known  Allergies  MEDICATIONS:  Prior to Admission:  Prescriptions prior to admission  Medication Sig Dispense Refill  . amLODipine (NORVASC) 5 MG tablet Take 5 mg by mouth 2 (two) times daily.      . metoprolol tartrate (LOPRESSOR) 25 MG tablet Take 25 mg by mouth 2 (two) times daily.      Marland Kitchen. oxyCODONE (OXY IR/ROXICODONE) 5 MG immediate release tablet Take 5 mg by mouth every 4 (four) hours as needed for severe pain.      . Vitamin D, Ergocalciferol, (DRISDOL) 50000 UNITS CAPS capsule Take 50,000 Units by mouth every 7 (seven) days.       Scheduled: . amLODipine  5 mg Oral BID  . enoxaparin (LOVENOX) injection  40 mg Subcutaneous Q24H  . metoprolol tartrate  25 mg Oral BID  . sodium chloride  3 mL Intravenous Q12H     ROS:                                                                                                                                       History obtained from unobtainable from patient due to mental status   Blood pressure 155/81, pulse 112, temperature 100 F (37.8 C), temperature source Oral, resp. rate 18, SpO2 98.00%.   Neurologic Examination:                                                                                                      General: In bed Mental Status: In bed, does not respond to noxious, or verbal stimuli. Cranial Nerves: II: Discs flat bilaterally; no blink to threat, pupils right 2mm slugish reactive and left 4 mm non reactive (old), round. 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  V,VII: face equal,no response to noxious facial stimulation VIII: no response when name called IX,X:cough reflex present XI: bilateral shoulder shrug XII: midline tongue extension   Motor: Extensor response to pain on right, flexion on left.  Sensory: no response to pain Deep Tendon Reflexes:   Right: Upper Extremity   Left: Upper extremity   biceps (C-5 to C-6) 2/4   biceps (C-5 to C-6) 2/4 tricep (C7) 2/4    triceps (C7) 2/4 Brachioradialis (C6) 2/4  Brachioradialis (C6) 2/4  Lower Extremity Lower Extremity  quadriceps (L-2 to L-4) 1/4   quadriceps (L-2 to L-4) 1/4 Achilles (S1) 0/4   Achilles (S1) 0/4  Plantars: Mute bilaterally Cerebellar: Unable to obtain Gait: not tested CV: pulses palpable throughout  Lab Results: Basic Metabolic Panel:  Recent Labs Lab 11/28/13 1226 11/29/13 0630 11/30/13 0605  NA 141 140 138  K 4.4 4.3 3.8  CL 102 103 101  CO2 26 24 22   GLUCOSE 123* 107* 87  BUN 17 12 13   CREATININE 0.88 0.85 0.88  CALCIUM 10.1 9.8 9.8  MG  --   --  1.5    Liver Function Tests:  Recent Labs Lab 11/28/13 1226  AST 21  ALT 18  ALKPHOS 102  BILITOT 0.5  PROT 8.4*  ALBUMIN 3.9   No results found for this basename: LIPASE, AMYLASE,  in the last 168 hours  Recent Labs Lab 11/28/13 1843  AMMONIA 17    CBC:  Recent Labs Lab 11/28/13 1226 11/29/13 0630 11/30/13 0605  WBC 8.0 9.5 9.9  NEUTROABS  --  6.7 6.9  HGB 12.9* 12.4* 12.2*  HCT 37.5* 36.5* 36.1*  MCV 83.9 84.7 83.8  PLT 178 184 198    Cardiac Enzymes:  Recent Labs Lab 11/28/13 1843  TROPONINI <0.30    Lipid Panel: No results found for this basename: CHOL, TRIG, HDL, CHOLHDL, VLDL, LDLCALC,  in the last 168 hours  CBG:  Recent Labs Lab 11/28/13 1256 11/30/13 0027 11/30/13 0358  GLUCAP 96 88 96    Microbiology: Results for orders placed during the hospital encounter of 11/28/13  URINE CULTURE     Status: None   Collection Time    11/28/13 11:50 AM      Result Value Ref Range Status   Specimen Description URINE, CATHETERIZED   Final   Special Requests Normal   Final   Culture  Setup Time     Final   Value: 11/28/2013 17:00     Performed at [facility] Count     Final   Value: NO GROWTH     Performed at [facility]    Culture     Final   Value: NO GROWTH     Performed at [facility]   Report Status 11/29/2013 FINAL   Final  CULTURE, BLOOD (ROUTINE X 2)     Status: None   Collection Time    11/28/13  5:13 PM      Result Value Ref Range Status   Specimen Description BLOOD FOREARM RIGHT   Final   Special Requests BOTTLES DRAWN AEROBIC ONLY 10CC   Final   Culture  Setup Time     Final   Value: 11/28/2013 22:22     Performed at [facility]   Culture     Final   Value:        BLOOD CULTURE RECEIVED NO GROWTH TO DATE CULTURE WILL BE HELD FOR 5 DAYS BEFORE ISSUING A FINAL NEGATIVE REPORT     Performed at [facility]   Report Status PENDING   Incomplete  CULTURE, BLOOD (ROUTINE X 2)     Status: None   Collection Time    11/28/13  5:24 PM      Result Value Ref Range Status   Specimen Description BLOOD HAND RIGHT   Final   Special Requests BOTTLES DRAWN AEROBIC ONLY 4CC   Final   Culture  Setup Time     Final   Value: 11/28/2013 22:23     Performed at [facility]   Culture     Final   Value:        BLOOD CULTURE RECEIVED NO GROWTH TO DATE CULTURE  WILL BE HELD FOR 5 DAYS BEFORE ISSUING A FINAL NEGATIVE REPORT     Performed at [facility]   Report Status PENDING   Incomplete  MRSA PCR SCREENING     Status: None   Collection Time    11/28/13  5:52 PM      Result Value Ref Range Status   MRSA by PCR NEGATIVE  NEGATIVE Final   Comment:            The GeneXpert MRSA Assay (FDA     approved for NASAL specimens     only), is one component of a     comprehensive MRSA colonization     surveillance program. It is not     intended to diagnose MRSA     infection nor to guide or     monitor treatment for     MRSA infections.    Coagulation Studies: No results found for this basename: LABPROT, INR,  in the last 72 hours  Imaging: Ct Head Wo Contrast  11/28/2013   CLINICAL DATA:  Altered mental status.  Hypertension.  Dementia.  EXAM: CT HEAD WITHOUT CONTRAST   TECHNIQUE: Contiguous axial images were obtained from the base of the skull through the vertex without intravenous contrast.  COMPARISON:  None.  FINDINGS: No intracranial hemorrhage.  Remote basal ganglia infarcts bilaterally. Remote left corona radiata infarct with encephalomalacia. Remote small left cerebellar infarct. No CT evidence of large acute infarct.  Mineralization globus pallidus.  Global atrophy without hydrocephalus.  No intracranial mass lesion noted on this unenhanced exam.  Vascular calcifications.  Opacification inferior medial frontal sinuses and ethmoid sinus air cells with mucosal thickening anterior aspect of the sphenoid sinus air cells. Moderate mucosal thickening maxillary sinuses with evidence of prior maxillary sinus surgery.  IMPRESSION: Remote infarcts and small vessel disease type changes without CT evidence of large acute infarct.  Atrophy without hydrocephalus.  Vascular calcifications.  Paranasal sinus mucosal thickening/ partial opacification as noted above.   Electronically Signed   By: Bridgett Larsson M.D.   On: 11/28/2013 14:52   Ct Angio Chest Pe W/cm &/or Wo Cm  11/29/2013   CLINICAL DATA:  Hypoxemia.  EXAM: CT ANGIOGRAPHY CHEST WITH CONTRAST  TECHNIQUE: Multidetector CT imaging of the chest was performed using the standard protocol during bolus administration of intravenous contrast. Multiplanar CT image reconstructions and MIPs were obtained to evaluate the vascular anatomy.  CONTRAST:  65mL OMNIPAQUE IOHEXOL 350 MG/ML SOLN  COMPARISON:  Chest x-ray dated 11/28/2013  FINDINGS: There are no pulmonary emboli, infiltrates, or effusions. Heart size is normal. Coronary artery stent is noted. Small hiatal hernia. No significant osseous abnormalities.  Common bile duct is at the upper limits of normal in size at 7 mm. No dilated intrahepatic ducts.  Review of the MIP images confirms the above findings.  IMPRESSION: No pulmonary emboli or other acute abnormalities. Small hiatal  hernia.   Electronically Signed   By: Geanie Cooley M.D.   On: 11/29/2013 13:38       Assessment and plan per attending neurologist  Felicie Morn PA-C [facility] [phone]  11/30/2013, 12:56 PM   Assessment/Plan: 72 YO male with new onset AMS with last day known normal 11/27/2013.  Patient currently obtunded and shows no response to pain.  EEG obtained earlier shows no epileptiform activity.  Given multiple infarcts in past and elevated BP while in hospital cannot exclude either shower of emboli or brain stem infarct.   I have discussed  with the daughter that he has a poor chance of return to independent function. She is discussing what his wishes would be with her brothers, but for now he remains full code.   Ritta Slot, MD [facility] [phone]  If 7pm- 7am, please page neurology on call as listed in AMION.

## 2013-11-30 NOTE — Progress Notes (Signed)
FPTS Interim Progress Note  S: Non-responsive, appears to be breathing comfortably currently. Family member at bedside  O: BP 167/86  Pulse 107  Temp(Src) 98.8 F (37.1 C) (Axillary)  Resp 20  Wt 153 lb 14.4 oz (69.809 kg)  SpO2 100%  General: NAD HEENT: audible secretions with breathing, does move head in response to trying to move tongue for posterior exam. CV: tachycardic, normal s1/s2, no murmur appreciated. Resp: transmitted upper airway noises, no appreciable w/r/c Neuro: not alert, responds to touch but not voice; no vocalization.   A/P: Seth West is a 73 y.o. male admitted for AMS and found to have extensive posterior circulation infarcts. Concern from neurology and primary team he may not be able to protect his airway from secretions; currently sat 100%. Neuro had discussion with daughter and he is currently FULL CODE pending daughter discussion with rest of family.  - continue NPO - stroke order set placed including PT/OT/SLP evals - appreciate CCM evaluation for possible intubation  Tawni CarnesAndrew Remedios Mckone, MD 11/30/2013, 6:07 PM PGY-2, [facility] FPTS Intern Pager: [phone], text pages welcome

## 2013-11-30 NOTE — Clinical Documentation Improvement (Signed)
  Chart Notes give diagnosis of Dementia. Also 7/8 Note states "combative" requiring soft restraints. Please address in documentation to reflect severity of illness and risk of mortality. Thank you.  Possible Clinical Conditions? - dementia with behavior disturbances - Other Condition   Thank You, Beverley FiedlerLaurie E Shamon Lobo ,RN Clinical Documentation Specialist:  [phone]  [facility]- Health Information Management

## 2013-12-01 ENCOUNTER — Inpatient Hospital Stay (HOSPITAL_COMMUNITY): Payer: Medicare Other

## 2013-12-01 DIAGNOSIS — I517 Cardiomegaly: Secondary | ICD-10-CM

## 2013-12-01 LAB — GLUCOSE, CAPILLARY
GLUCOSE-CAPILLARY: 60 mg/dL — AB (ref 70–99)
Glucose-Capillary: 132 mg/dL — ABNORMAL HIGH (ref 70–99)
Glucose-Capillary: 71 mg/dL (ref 70–99)
Glucose-Capillary: 78 mg/dL (ref 70–99)
Glucose-Capillary: 86 mg/dL (ref 70–99)

## 2013-12-01 LAB — CBC WITH DIFFERENTIAL/PLATELET
Basophils Absolute: 0 10*3/uL (ref 0.0–0.1)
Basophils Relative: 0 % (ref 0–1)
Eosinophils Absolute: 0.5 10*3/uL (ref 0.0–0.7)
Eosinophils Relative: 6 % — ABNORMAL HIGH (ref 0–5)
HCT: 33.6 % — ABNORMAL LOW (ref 39.0–52.0)
HEMOGLOBIN: 11.4 g/dL — AB (ref 13.0–17.0)
LYMPHS PCT: 26 % (ref 12–46)
Lymphs Abs: 2.3 10*3/uL (ref 0.7–4.0)
MCH: 28.3 pg (ref 26.0–34.0)
MCHC: 33.9 g/dL (ref 30.0–36.0)
MCV: 83.4 fL (ref 78.0–100.0)
MONOS PCT: 9 % (ref 3–12)
Monocytes Absolute: 0.8 10*3/uL (ref 0.1–1.0)
NEUTROS ABS: 5.4 10*3/uL (ref 1.7–7.7)
NEUTROS PCT: 59 % (ref 43–77)
Platelets: 184 10*3/uL (ref 150–400)
RBC: 4.03 MIL/uL — ABNORMAL LOW (ref 4.22–5.81)
RDW: 13.9 % (ref 11.5–15.5)
WBC: 9 10*3/uL (ref 4.0–10.5)

## 2013-12-01 LAB — HEMOGLOBIN A1C
Hgb A1c MFr Bld: 5.3 % (ref <5.7)
Mean Plasma Glucose: 105 mg/dL (ref <117)

## 2013-12-01 LAB — LIPID PANEL
CHOL/HDL RATIO: 2.1 ratio
CHOLESTEROL: 123 mg/dL (ref 0–200)
HDL: 58 mg/dL (ref >39)
LDL Cholesterol: 48 mg/dL (ref 0–99)
Triglycerides: 83 mg/dL (ref <150)
VLDL: 17 mg/dL (ref 0–40)

## 2013-12-01 LAB — BASIC METABOLIC PANEL
ANION GAP: 16 — AB (ref 5–15)
BUN: 17 mg/dL (ref 6–23)
CHLORIDE: 103 meq/L (ref 96–112)
CO2: 20 mEq/L (ref 19–32)
Calcium: 9.3 mg/dL (ref 8.4–10.5)
Creatinine, Ser: 0.91 mg/dL (ref 0.50–1.35)
GFR calc non Af Amer: 81 mL/min — ABNORMAL LOW (ref >90)
Glucose, Bld: 74 mg/dL (ref 70–99)
POTASSIUM: 3.5 meq/L — AB (ref 3.7–5.3)
Sodium: 139 mEq/L (ref 137–147)

## 2013-12-01 LAB — PROTIME-INR
INR: 1.16 (ref 0.00–1.49)
PROTHROMBIN TIME: 14.8 s (ref 11.6–15.2)

## 2013-12-01 MED ORDER — ASPIRIN 300 MG RE SUPP
300.0000 mg | Freq: Every day | RECTAL | Status: DC
  Administered 2013-12-01: 300 mg via RECTAL
  Filled 2013-12-01: qty 1

## 2013-12-01 MED ORDER — ATORVASTATIN CALCIUM 20 MG PO TABS
20.0000 mg | ORAL_TABLET | Freq: Every day | ORAL | Status: DC
  Filled 2013-12-01: qty 1

## 2013-12-01 MED ORDER — MORPHINE SULFATE 2 MG/ML IJ SOLN: 1.0000 mg | INTRAMUSCULAR | Status: DC | PRN

## 2013-12-01 MED ORDER — LORAZEPAM BOLUS VIA INFUSION
2.0000 mg | INTRAVENOUS | Status: DC | PRN
  Filled 2013-12-01: qty 5

## 2013-12-01 MED ORDER — MORPHINE BOLUS VIA INFUSION
5.0000 mg | INTRAVENOUS | Status: DC | PRN
  Filled 2013-12-01: qty 20

## 2013-12-01 MED ORDER — MORPHINE SULFATE 10 MG/ML IJ SOLN
10.0000 mg/h | INTRAVENOUS | Status: DC
  Administered 2013-12-01: 5 mg/h via INTRAVENOUS
  Administered 2013-12-01 – 2013-12-04 (×7): 10 mg/h via INTRAVENOUS
  Filled 2013-12-01 (×8): qty 10

## 2013-12-01 MED ORDER — SODIUM CHLORIDE 0.9 % IV SOLN
1.0000 mg/h | INTRAVENOUS | Status: DC
  Administered 2013-12-01: 5 mg/h via INTRAVENOUS
  Filled 2013-12-01: qty 10

## 2013-12-01 MED ORDER — DEXTROSE 50 % IV SOLN
INTRAVENOUS | Status: AC
  Filled 2013-12-01: qty 50

## 2013-12-01 MED ORDER — LORAZEPAM 2 MG/ML IJ SOLN: 2.0000 mg | INTRAMUSCULAR | Status: DC | PRN

## 2013-12-01 MED ORDER — ASPIRIN EC 325 MG PO TBEC: 325.0000 mg | DELAYED_RELEASE_TABLET | Freq: Every day | ORAL | Status: DC

## 2013-12-01 MED ORDER — BIOTENE DRY MOUTH MT LIQD: 15.0000 mL | Freq: Four times a day (QID) | OROMUCOSAL | Status: DC

## 2013-12-01 MED ORDER — DEXTROSE 5 % IV SOLN
5.0000 mg/h | INTRAVENOUS | Status: DC
  Filled 2013-12-01: qty 25

## 2013-12-01 MED ORDER — DEXTROSE 50 % IV SOLN
25.0000 mL | Freq: Once | INTRAVENOUS | Status: AC | PRN
  Administered 2013-12-01: 25 mL via INTRAVENOUS

## 2013-12-01 MED ORDER — CHLORHEXIDINE GLUCONATE 0.12 % MT SOLN: 15.0000 mL | Freq: Two times a day (BID) | OROMUCOSAL | Status: DC

## 2013-12-01 MED ORDER — SCOPOLAMINE 1 MG/3DAYS TD PT72
1.0000 | MEDICATED_PATCH | TRANSDERMAL | Status: DC
  Administered 2013-12-01: 1.5 mg via TRANSDERMAL
  Filled 2013-12-01: qty 1

## 2013-12-01 NOTE — Progress Notes (Signed)
SLP Cancellation Note  Patient Details Name: Seth West MRN: [id number] DOB: 12-28-1940   Cancelled treatment:       Reason Eval/Treat Not Completed: Patient not medically ready. Pt intubated will sign off.    Lowery Paullin, Riley NearingBonnie Caroline 12/01/2013, 8:24 AM

## 2013-12-01 NOTE — Progress Notes (Signed)
OT Cancellation Note  Patient Details Name: Verda Cuminsathaniel Korb MRN: [id number] DOB: 1940/11/25   Cancelled Treatment:    Reason Eval/Treat Not Completed: Other (comment) Pt is comfort care. Signing off.  [facility] Hospital[facility],HILLARY Lynnetta Tom, OTR/L  [phone] 12/01/2013 12/01/2013, 3:01 PM

## 2013-12-01 NOTE — Procedures (Signed)
Extubation Procedure Note  Patient Details:   Name: Seth West DOB: 07-21-1940 MRN: [id number]   Airway Documentation:     Evaluation  O2 sats: stable throughout Complications: No apparent complications Patient did tolerate procedure well. Bilateral Breath Sounds: Clear;Diminished Suctioning: Airway Yes  Joylene JohnSweeney, Ardella Chhim Mitchell 12/01/2013, 3:25 PM

## 2013-12-01 NOTE — Progress Notes (Signed)
PT Cancellation Note  Patient Details Name: Seth West MRN: [id number] DOB: 06-22-1940   Cancelled Treatment:    Reason Eval/Treat Not Completed: Other (comment) (comfort care at this time)   [name], [name] 12/01/2013, 2:19 PM [name], PT DPT  [phone]

## 2013-12-01 NOTE — Plan of Care (Signed)
Problem: Acute Treatment Outcomes Goal: Other Acute Treatment Outcomes OG tube placement Outcome: Completed/Met Date Met:  12/01/13 OG tube placed upon admission to [facility]

## 2013-12-01 NOTE — Progress Notes (Signed)
FPTS Social Note  Pt Overview and Major Events to Date:  7/7 Patient in normal state of health previously presenting with AMS. Initial workup negative. CT revealing for remote infarcts and small vessel disease type changes without CT evidence of large acute infarct.  7/8 EEG non-revealing.  7/9: Neurology consulted.  MRI obtained showing Acute bilateral mid to superior cerebellar infarcts greater on the left. Acute pontine infarcts greater on the right. Acute medial left temporal lobe -occipital lobe infarct. Acute small left thalamic Infarcts and a lack of flow in the distal basilar artery, superior cerebral arteries, portions of the posterior cerebral arteries and anterior inferior cerebellar arteries. CCM consulted: pt with no increased WOB saturating well on RA, however concerns about managing secretions--> intubated. Antimicrobial bonded/coated triple lumen catheter placed in the right IJ. Arterial catheter placed.   S: Patient is intubated in ICU. Daughter has discussed plan with neurologist and will discuss with siblings.   PE:  Filed Vitals:   12/01/13 0900  BP: 147/81  Pulse: 94  Temp:   Resp: 14  Gen: Laying intubated in bed, NAD  CV: RRR, no murmurs, rubs or gallops noted Pulm: Mechanical breath sounds b/l. Rhonchi bilaterally. Abd: Soft, ND, + BS  Ext: No edema noted.    A/P: Seth West is a 73 y/o male admitted with acute encephalopathy found to have numerous posterior circulation infarcts on MRI.  Plan per CCM (primary team while in ICU) and neurology, appreciate management. Will continue to follow along.

## 2013-12-01 NOTE — Progress Notes (Signed)
Patient seen and evaluated.  All the members of the family including daughter and son and wife have been explained about the diagnosis, current plan and prognosis. They have expressed that pt would not want to living in a condition that no meaningful neurological recovery and in a condition that he will no longer independent. They are in unanimous decision (as per patient wishes) that they want withdraw of care and comfort care given the nature of the condition is serious and terminal. As per family wishes, we will initiate withdraw of care and comfort care and stop any life prolonging therapies. I agree with withdraw of care and comfort care. Discussed with CCM team and they are in agreement.  Marvel PlanJindong Keisean Skowron, MD PhD 12/01/2013 2:12 PM

## 2013-12-01 NOTE — Progress Notes (Signed)
eLink Physician-Brief Progress Note Patient Name: Seth West DOB: 04/09/1941 MRN: [id number]  Date of Service  12/01/2013   HPI/Events of Note   RN requesting orders for withdrawal of life sustaining measures Apparently extubation / morphine gtt ordered by Neurologist but order set is not instituted    eICU Interventions   Confirmed documentation by Dr. Marvel PlanJindong Xu (Neurology) about family discussion and implementation of comfort measures Order set placed eLink  staff notified    Intervention Category Intermediate Interventions: Communication with other healthcare providers and/or family  Lonia FarberZUBELEVITSKIY, Aissatou Fronczak 12/01/2013, 3:10 PM

## 2013-12-01 NOTE — Progress Notes (Signed)
FMTS Attending Note  Patient discussed with the resident team. Primary management per ICU physicians.  Donnella ShamKyle Loriana Samad MD

## 2013-12-01 NOTE — Progress Notes (Signed)
Hypoglycemic Event  CBG: 60  Treatment: D50 IV 25 mL  Symptoms: None (Pt on Vent)  Follow-up CBG: Time:1242 CBG Result:132  Possible Reasons for Event: Inadequate meal intake  Comments/MD notified:Dr. Roda ShuttersXu (on unit)    Seth West, Seth West  Remember to initiate Hypoglycemia Order Set & complete

## 2013-12-01 NOTE — Progress Notes (Signed)
  Echocardiogram 2D Echocardiogram has been performed.  Seth West 12/01/2013, 10:19 AM

## 2013-12-01 NOTE — Progress Notes (Signed)
Nutrition Brief Note  Chart reviewed. Pt now transitioning to comfort care.  No further nutrition interventions warranted at this time.  Please re-consult as needed.   Talar Fraley RD, LDN, CNSC [phone] Pager [phone] After Hours Pager    

## 2013-12-01 NOTE — Progress Notes (Signed)
UR completed.  Yu Peggs, RN BSN MHA CCM Trauma/Neuro ICU Case Manager [phone]  

## 2013-12-01 NOTE — Consult Note (Signed)
PULMONARY / CRITICAL CARE MEDICINE   Name: Seth West MRN: [id number] DOB: Jul 31, 1940    ADMISSION DATE:  11/28/2013 CONSULTATION DATE:  11/30/2013  REFERRING MD :  FPTS PRIMARY SERVICE: FPTS  CHIEF COMPLAINT:  Respiratory failure and CVA  BRIEF PATIENT DESCRIPTION: 73 year old male with PMH of mild dementia and HTN, on 7/7 patient was noted to be very confused when daughter went to see him and give him his BP medications.  Patient was admitted to [facility] under FPTS.  On 7/9 MRI was performed that showed multiple infracts throughout the brain (see MRI).  FPTS was concerned that patient is not protecting his airway and PCCM was called on consultation.  Patient is full code.  SIGNIFICANT EVENTS / STUDIES:  7/7 admission for AMS. 7/9 MRI with diffuse infarcts.  LINES / TUBES: ETT 7/9>>> R IJ TLC 7/9>>> R radial a-line 7/9>>> Foley 7/9>>> OGT 7/9>>>  CULTURES: Urine 7/7>>>NTD Sputum 7/7>>>NTD MRSA swab 7/7>>>Neg.  ANTIBIOTICS: None  SUBJECTIVE: Off sedation, completely unresponsive.  VITAL SIGNS: Temp:  [98.5 F (36.9 C)-99.1 F (37.3 C)] 99.1 F (37.3 C) (07/10 0320) Pulse Rate:  [73-126] 104 (07/10 0817) Resp:  [13-23] 16 (07/10 0817) BP: (115-211)/(71-104) 176/82 mmHg (07/10 0817) SpO2:  [97 %-100 %] 100 % (07/10 0817) Arterial Line BP: (137-252)/(60-120) 190/81 mmHg (07/10 0817) FiO2 (%):  [40 %-50 %] 40 % (07/10 0700) Weight:  [152 lb 8.9 oz (69.2 kg)-153 lb 14.4 oz (69.809 kg)] 152 lb 8.9 oz (69.2 kg) (07/09 2100) HEMODYNAMICS:   VENTILATOR SETTINGS: Vent Mode:  [-] PRVC FiO2 (%):  [40 %-50 %] 40 % Set Rate:  [14 bmp] 14 bmp Vt Set:  [620 mL] 620 mL PEEP:  [5 cmH20] 5 cmH20 Plateau Pressure:  [17 cmH20-19 cmH20] 17 cmH20 INTAKE / OUTPUT: Intake/Output     07/09 0701 - 07/10 0700 07/10 0701 - 07/11 0700   I.V. (mL/kg) 825 (11.9) 75 (1.1)   IV Piggyback 141.7    Total Intake(mL/kg) 966.7 (14) 75 (1.1)   Urine (mL/kg/hr) 825 (0.5) 100 (0.9)   Total Output  825 100   Net +141.7 -25        Stool Occurrence 3 x      PHYSICAL EXAMINATION: General:  Chronically ill appearing male, completely unresponsive. Neuro:  Unresponsive, does not withdraw to pain, breathing and gurgling.  No gag reflex. HEENT:  [place]/AT, PERRL, EOM-spontaneous, normal dolls eye. Cardiovascular:  RRR, Nl S1/S2, -M/R/G. Lungs:  Transmitted upper airway sounds. Abdomen:  Soft, NT, ND and +BS. Musculoskeletal:  -edema and -tenderness.  R AKA. Skin:  Intact.  LABS:  CBC  Recent Labs Lab 11/29/13 0630 11/30/13 0605 12/01/13 0445  WBC 9.5 9.9 9.0  HGB 12.4* 12.2* 11.4*  HCT 36.5* 36.1* 33.6*  PLT 184 198 184   Coag's  Recent Labs Lab 12/01/13 0445  INR 1.16   BMET  Recent Labs Lab 11/29/13 0630 11/30/13 0605 12/01/13 0445  NA 140 138 139  K 4.3 3.8 3.5*  CL 103 101 103  CO2 24 22 20   BUN 12 13 17   CREATININE 0.85 0.88 0.91  GLUCOSE 107* 87 74   Electrolytes  Recent Labs Lab 11/29/13 0630 11/30/13 0605 12/01/13 0445  CALCIUM 9.8 9.8 9.3  MG  --  1.5  --    Sepsis Markers  Recent Labs Lab 11/28/13 1358 11/28/13 1843 11/29/13 0630 11/30/13 0605 11/30/13 1005  LATICACIDVEN 3.1*  --  1.7  --  1.0  PROCALCITON  --  <0.10  --  <  0.10  --    ABG  Recent Labs Lab 11/28/13 1334 11/30/13 2127  PHART 7.416 7.446  PCO2ART 39.4 30.7*  PO2ART 68.0* 134.0*   Liver Enzymes  Recent Labs Lab 11/28/13 1226  AST 21  ALT 18  ALKPHOS 102  BILITOT 0.5  ALBUMIN 3.9   Cardiac Enzymes  Recent Labs Lab 11/28/13 1843  TROPONINI <0.30   Glucose  Recent Labs Lab 11/30/13 0027 11/30/13 0358 11/30/13 2009 11/30/13 2137 11/30/13 2353 12/01/13 0319  GLUCAP 88 96 62* 113* 86 78    Imaging Ct Angio Chest Pe W/cm &/or Wo Cm  11/29/2013   CLINICAL DATA:  Hypoxemia.  EXAM: CT ANGIOGRAPHY CHEST WITH CONTRAST  TECHNIQUE: Multidetector CT imaging of the chest was performed using the standard protocol during bolus administration of  intravenous contrast. Multiplanar CT image reconstructions and MIPs were obtained to evaluate the vascular anatomy.  CONTRAST:  65mL OMNIPAQUE IOHEXOL 350 MG/ML SOLN  COMPARISON:  Chest x-ray dated 11/28/2013  FINDINGS: There are no pulmonary emboli, infiltrates, or effusions. Heart size is normal. Coronary artery stent is noted. Small hiatal hernia. No significant osseous abnormalities.  Common bile duct is at the upper limits of normal in size at 7 mm. No dilated intrahepatic ducts.  Review of the MIP images confirms the above findings.  IMPRESSION: No pulmonary emboli or other acute abnormalities. Small hiatal hernia.   Electronically Signed   By: Geanie Cooley M.D.   On: 11/29/2013 13:38   [name] Head Wo Contrast  11/30/2013   CLINICAL DATA:  Diabetic hypertensive patient presenting with altered mental status.  EXAM: MRI HEAD WITHOUT CONTRAST  MRA HEAD WITHOUT CONTRAST  TECHNIQUE: Multiplanar, multiecho pulse sequences of the brain and surrounding structures were obtained without intravenous contrast. Angiographic images of the head were obtained using MRA technique without contrast.  COMPARISON:  11/28/2013 CT.  No comparison MR.  FINDINGS: MRI HEAD FINDINGS  Acute bilateral mid to superior cerebellar infarcts greater on the left. Acute pontine infarcts greater on the right. Acute medial left temporal lobe -occipital lobe infarct. Acute small left thalamic infarcts.  Remote infarct left lenticular nucleus/ caudate. Remote small cerebellar infarcts. Small vessel disease type changes.  No intracranial hemorrhage.  Atrophy without hydrocephalus.  No intracranial mass lesion noted on this unenhanced exam.  Abnormal appearance of the posterior circulation.  Please see below.  Paranasal sinus mucosal thickening/ partial opacification.  MRA HEAD FINDINGS  Lack of flow in the distal basilar artery, superior cerebral arteries, portions of the posterior cerebral arteries and anterior inferior cerebellar arteries.   Narrowing of the vertebral arteries greater on the left.  Poor delineation of posterior inferior cerebellar arteries.  Mild narrowing and irregularity of the cavernous segment and supraclinoid segment of the internal carotid arteries bilaterally.  Mild narrowing and irregularity of the M1 segment of the middle cerebral artery bilaterally greater on the right. Mild narrowing of the middle cerebral artery bifurcation greater on right.  Mild narrowing and irregularity middle cerebral artery and anterior cerebral artery branches bilaterally.  Ectatic internal carotid artery distal vertical cervical segment more notable on the right.  No aneurysm noted.  IMPRESSION: MRI HEAD :  Acute bilateral mid to superior cerebellar infarcts greater on the left. Acute pontine infarcts greater on the right. Acute medial left temporal lobe -occipital lobe infarct. Acute small left thalamic infarcts.  Remote infarct left lenticular nucleus/ caudate. Remote small cerebellar infarcts. Small vessel disease type changes.  No intracranial hemorrhage.  Atrophy without hydrocephalus.  Abnormal appearance of the posterior circulation.  Please see below.  Paranasal sinus mucosal thickening/ partial opacification.  MRA HEAD :  Lack of flow in the distal basilar artery, superior cerebral arteries, portions of the posterior cerebral arteries and anterior inferior cerebellar arteries.  Narrowing of the vertebral arteries greater on the left.  Poor delineation of posterior inferior cerebellar arteries.  Less notable atherosclerotic type changes anterior circulation as noted above.  These results were called by telephone at the time of interpretation on 11/30/2013 at 3:37 PM to Dr. Amada Jupiter , who verbally acknowledged these results.   Electronically Signed   By: Bridgett Larsson M.D.   On: 11/30/2013 16:00   Mr Brain Wo Contrast  11/30/2013   CLINICAL DATA:  Diabetic hypertensive patient presenting with altered mental status.  EXAM: MRI HEAD WITHOUT  CONTRAST  MRA HEAD WITHOUT CONTRAST  TECHNIQUE: Multiplanar, multiecho pulse sequences of the brain and surrounding structures were obtained without intravenous contrast. Angiographic images of the head were obtained using MRA technique without contrast.  COMPARISON:  11/28/2013 CT.  No comparison MR.  FINDINGS: MRI HEAD FINDINGS  Acute bilateral mid to superior cerebellar infarcts greater on the left. Acute pontine infarcts greater on the right. Acute medial left temporal lobe -occipital lobe infarct. Acute small left thalamic infarcts.  Remote infarct left lenticular nucleus/ caudate. Remote small cerebellar infarcts. Small vessel disease type changes.  No intracranial hemorrhage.  Atrophy without hydrocephalus.  No intracranial mass lesion noted on this unenhanced exam.  Abnormal appearance of the posterior circulation.  Please see below.  Paranasal sinus mucosal thickening/ partial opacification.  MRA HEAD FINDINGS  Lack of flow in the distal basilar artery, superior cerebral arteries, portions of the posterior cerebral arteries and anterior inferior cerebellar arteries.  Narrowing of the vertebral arteries greater on the left.  Poor delineation of posterior inferior cerebellar arteries.  Mild narrowing and irregularity of the cavernous segment and supraclinoid segment of the internal carotid arteries bilaterally.  Mild narrowing and irregularity of the M1 segment of the middle cerebral artery bilaterally greater on the right. Mild narrowing of the middle cerebral artery bifurcation greater on right.  Mild narrowing and irregularity middle cerebral artery and anterior cerebral artery branches bilaterally.  Ectatic internal carotid artery distal vertical cervical segment more notable on the right.  No aneurysm noted.  IMPRESSION: MRI HEAD :  Acute bilateral mid to superior cerebellar infarcts greater on the left. Acute pontine infarcts greater on the right. Acute medial left temporal lobe -occipital lobe infarct.  Acute small left thalamic infarcts.  Remote infarct left lenticular nucleus/ caudate. Remote small cerebellar infarcts. Small vessel disease type changes.  No intracranial hemorrhage.  Atrophy without hydrocephalus.  Abnormal appearance of the posterior circulation.  Please see below.  Paranasal sinus mucosal thickening/ partial opacification.  MRA HEAD :  Lack of flow in the distal basilar artery, superior cerebral arteries, portions of the posterior cerebral arteries and anterior inferior cerebellar arteries.  Narrowing of the vertebral arteries greater on the left.  Poor delineation of posterior inferior cerebellar arteries.  Less notable atherosclerotic type changes anterior circulation as noted above.  These results were called by telephone at the time of interpretation on 11/30/2013 at 3:37 PM to Dr. Amada Jupiter , who verbally acknowledged these results.   Electronically Signed   By: Bridgett Larsson M.D.   On: 11/30/2013 16:00   Portable Chest Xray In Am  12/01/2013   CLINICAL DATA:  Endotracheal tube placement.  EXAM: PORTABLE CHEST -  1 VIEW  COMPARISON:  One-view chest 11/30/2013.  FINDINGS: Endotracheal tube remains low, 1.5 cm above the carina. It could be retracted 2-3 cm for more optimal positioning. A right IJ line is stable. The patient is significantly rotated to the right. The NG tube courses off the inferior border of the film. Mild bibasilar airspace disease likely reflects atelectasis.  IMPRESSION: 1. The endotracheal tube remains low, 1.5 cm above the carina. The could be retracted 2-3 cm for more optimal positioning. 2. Persistent low lung volumes and mild bibasilar atelectasis.   Electronically Signed   By: Gennette Pac M.D.   On: 12/01/2013 07:43   Dg Chest Port 1 View  11/30/2013   CLINICAL DATA:  Endotracheal tube and central line placement.  EXAM: PORTABLE CHEST - 1 VIEW  COMPARISON:  11/28/2013  FINDINGS: Endotracheal tube tip lies 1.6 cm above the carina. Right internal jugular central  venous line tip lies in the lower superior vena cava. No pneumothorax.  Lungs remain clear.  Cardiac silhouette is normal in size.  IMPRESSION: Endotracheal tube and right internal jugular central venous line are well positioned. No pneumothorax. No other change.   Electronically Signed   By: Amie Portland M.D.   On: 11/30/2013 21:24   Dg Abd Portable 1v  12/01/2013   CLINICAL DATA:  An OG tube placement.  EXAM: PORTABLE ABDOMEN - 1 VIEW  COMPARISON:  08/02/2006  FINDINGS: Enteric tube tip is demonstrated at the upper limits of the image and localizes to the upper epigastric region. This indicates the tip is likely in the upper stomach. Bowel gas pattern is normal with scattered gas and stool in the colon and scattered gas in the small bowel. No small or large bowel distention. No radiopaque stones.  IMPRESSION: Enteric tube tip appears to localize to the upper mid stomach region.   Electronically Signed   By: Burman Nieves M.D.   On: 12/01/2013 03:37     CXR: WNL  ASSESSMENT / PLAN:  PULMONARY A: Acute respiratory failure due to inability to protect his airway. P:   - Full vent support, hold weaning trial until neuro finishes work-up. - F/U CXR and ABG. - Adjust vent accordingly.  CARDIOVASCULAR A: HTN by history.  Target BP per neuro. P:  - Hold all PO meds. - Target BP per neuro.  RENAL A:  No active issues. P:   - BMET in AM. - Replace electrolytes as indicated. - KVO IVF once TF are at goal.  GASTROINTESTINAL A:  Unable to protect airway. P:   - Consult nutrition in AM for TF.  HEMATOLOGIC A:  No active issues. P:  - Monitor with daily CBC.  INFECTIOUS A:  No evidence of active infection. P:   - Cultures negative, f/u. - Hold off abx.  ENDOCRINE A:  No active issues.   P:   - Monitor.  NEUROLOGIC A:  Multiple strokes, likely showering from a cardiac origin. P:   - Hold all sedation (patient is unresponsive). - Ordered 2D echo looking for source of  infarct. - Neuro consult appreciated. - Carotid dopplers.  TODAY'S SUMMARY: Maintain on full vent support, echo ordered, start TF, KVO IVF and will need discussion with family regarding plan of care.  I have personally obtained a history, examined the patient, evaluated laboratory and imaging results, formulated the assessment and plan and placed orders.  CRITICAL CARE: The patient is critically ill with multiple organ systems failure and requires high complexity decision making for  assessment and support, frequent evaluation and titration of therapies, application of advanced monitoring technologies and extensive interpretation of multiple databases. Critical Care Time devoted to patient care services described in this note is 35 minutes.   Alyson Reedy, M.D. [facility] Pulmonary/Critical Care Medicine. Pager: [phone]. After hours pager: [phone].  12/01/2013, 8:39 AM

## 2013-12-01 NOTE — Progress Notes (Signed)
Stroke Team Progress Note   Admit Date: 11/28/2013  LOS: 3 days   HISTORY 73 y.o. male with history of HTN, PVD and dementia. Per pt's daughter states he was in his normal state of health (able to feed himself, dress himself, slightly demented but would know his name, time of year, location when in nursing home) up until yesterday. She found him in his vomit and not responsive at which point recommended ED evaluation. While in the ED he was able to recognize his daughter but since then he has not been vocal. Currently he is non responsive to pain, is not moving extremities and showing ronchorus breath sounds.  RPR, HIV, Troponin, TSH, ammonia, UDS, UA all WNL  EEG showed no epileptiform activity.  MRI showed multifocal posterior circulation stroke including b/l cerebellar, pons, midbrain and left occipital lobe. Pt was intubated for airway protection and transferred to ICU for further care.  Patient was not administered TPA secondary to unclear time onset.  SUBJECTIVE  family including daughter, son and wife are at the bedside. He is still intubated and not respond to voice. Posturing on pain stimulation. Copious secretions.  OBJECTIVE Most recent Vital Signs: Filed Vitals:   12/01/13 1200 12/01/13 1217 12/01/13 1300 12/01/13 1400  BP: 140/83  183/83 168/85  Pulse: 97  99 97  Temp:  99.9 F (37.7 C)    TempSrc:  Axillary    Resp: 14  16 14   Height:      Weight:      SpO2: 100%  100% 100%   CBG (last 3)   Recent Labs  12/01/13 0838 12/01/13 1215 12/01/13 1245  GLUCAP 71 60* 132*     IV Fluid Intake:   . LORazepam (ATIVAN) infusion    . morphine 10 mg/hr (12/01/13 1510)    Medications: Scheduled:   Continuous Infusions:  . LORazepam (ATIVAN) infusion    . morphine 10 mg/hr (12/01/13 1510)   PRN: @MEDSPRNSH @  Diet:  NPO Activity:  Bedrest DVT Prophylaxis:  Lovenox  CLINICALLY SIGNIFICANT STUDIES Basic Metabolic Panel:  Recent Labs Lab 11/30/13 0605  12/01/13 0445  NA 138 139  K 3.8 3.5*  CL 101 103  CO2 22 20  GLUCOSE 87 74  BUN 13 17  CREATININE 0.88 0.91  CALCIUM 9.8 9.3  MG 1.5  --    Liver Function Tests:  Recent Labs Lab 11/28/13 1226  AST 21  ALT 18  ALKPHOS 102  BILITOT 0.5  PROT 8.4*  ALBUMIN 3.9   CBC:  Recent Labs Lab 11/30/13 0605 12/01/13 0445  WBC 9.9 9.0  NEUTROABS 6.9 5.4  HGB 12.2* 11.4*  HCT 36.1* 33.6*  MCV 83.8 83.4  PLT 198 184   Coagulation:  Recent Labs Lab 12/01/13 0445  LABPROT 14.8  INR 1.16   Cardiac Enzymes:  Recent Labs Lab 11/28/13 1843  TROPONINI <0.30   Urinalysis:  Recent Labs Lab 11/28/13 1150  COLORURINE YELLOW  LABSPEC 1.022  PHURINE 5.5  GLUCOSEU NEGATIVE  HGBUR NEGATIVE  BILIRUBINUR NEGATIVE  KETONESUR NEGATIVE  PROTEINUR NEGATIVE  UROBILINOGEN 0.2  NITRITE NEGATIVE  LEUKOCYTESUR NEGATIVE   Lipid Panel    Component Value Date/Time   CHOL 123 12/01/2013 0852   TRIG 83 12/01/2013 0852   HDL 58 12/01/2013 0852   CHOLHDL 2.1 12/01/2013 0852   VLDL 17 12/01/2013 0852   LDLCALC 48 12/01/2013 0852   HgbA1C .resu Lab Results  Component Value Date   HGBA1C 5.3 12/01/2013   TSH  Lab  Results  Component Value Date   TSH 0.991 11/28/2013    Urine Drug Screen:     Component Value Date/Time   LABOPIA NONE DETECTED 11/28/2013 1150   COCAINSCRNUR NONE DETECTED 11/28/2013 1150   LABBENZ NONE DETECTED 11/28/2013 1150   AMPHETMU NONE DETECTED 11/28/2013 1150   THCU NONE DETECTED 11/28/2013 1150   LABBARB NONE DETECTED 11/28/2013 1150    Alcohol Level:  Recent Labs Lab 11/28/13 1843  ETH <11    [name] Head Wo Contrast  11/30/2013   CLINICAL DATA:  Diabetic hypertensive patient presenting with altered mental status.  EXAM: MRI HEAD WITHOUT CONTRAST  MRA HEAD WITHOUT CONTRAST  TECHNIQUE: Multiplanar, multiecho pulse sequences of the brain and surrounding structures were obtained without intravenous contrast. Angiographic images of the head were obtained using MRA  technique without contrast.  COMPARISON:  11/28/2013 CT.  No comparison MR.  FINDINGS: MRI HEAD FINDINGS  Acute bilateral mid to superior cerebellar infarcts greater on the left. Acute pontine infarcts greater on the right. Acute medial left temporal lobe -occipital lobe infarct. Acute small left thalamic infarcts.  Remote infarct left lenticular nucleus/ caudate. Remote small cerebellar infarcts. Small vessel disease type changes.  No intracranial hemorrhage.  Atrophy without hydrocephalus.  No intracranial mass lesion noted on this unenhanced exam.  Abnormal appearance of the posterior circulation.  Please see below.  Paranasal sinus mucosal thickening/ partial opacification.  MRA HEAD FINDINGS  Lack of flow in the distal basilar artery, superior cerebral arteries, portions of the posterior cerebral arteries and anterior inferior cerebellar arteries.  Narrowing of the vertebral arteries greater on the left.  Poor delineation of posterior inferior cerebellar arteries.  Mild narrowing and irregularity of the cavernous segment and supraclinoid segment of the internal carotid arteries bilaterally.  Mild narrowing and irregularity of the M1 segment of the middle cerebral artery bilaterally greater on the right. Mild narrowing of the middle cerebral artery bifurcation greater on right.  Mild narrowing and irregularity middle cerebral artery and anterior cerebral artery branches bilaterally.  Ectatic internal carotid artery distal vertical cervical segment more notable on the right.  No aneurysm noted.  IMPRESSION: MRI HEAD :  Acute bilateral mid to superior cerebellar infarcts greater on the left. Acute pontine infarcts greater on the right. Acute medial left temporal lobe -occipital lobe infarct. Acute small left thalamic infarcts.  Remote infarct left lenticular nucleus/ caudate. Remote small cerebellar infarcts. Small vessel disease type changes.  No intracranial hemorrhage.  Atrophy without hydrocephalus.  Abnormal  appearance of the posterior circulation.  Please see below.  Paranasal sinus mucosal thickening/ partial opacification.  MRA HEAD :  Lack of flow in the distal basilar artery, superior cerebral arteries, portions of the posterior cerebral arteries and anterior inferior cerebellar arteries.  Narrowing of the vertebral arteries greater on the left.  Poor delineation of posterior inferior cerebellar arteries.  Less notable atherosclerotic type changes anterior circulation as noted above.  These results were called by telephone at the time of interpretation on 11/30/2013 at 3:37 PM to Dr. Amada Jupiter , who verbally acknowledged these results.   Electronically Signed   By: Bridgett Larsson M.D.   On: 11/30/2013 16:00   Mr Brain Wo Contrast  11/30/2013   CLINICAL DATA:  Diabetic hypertensive patient presenting with altered mental status.  EXAM: MRI HEAD WITHOUT CONTRAST  MRA HEAD WITHOUT CONTRAST  TECHNIQUE: Multiplanar, multiecho pulse sequences of the brain and surrounding structures were obtained without intravenous contrast. Angiographic images of the head were obtained  using MRA technique without contrast.  COMPARISON:  11/28/2013 CT.  No comparison MR.  FINDINGS: MRI HEAD FINDINGS  Acute bilateral mid to superior cerebellar infarcts greater on the left. Acute pontine infarcts greater on the right. Acute medial left temporal lobe -occipital lobe infarct. Acute small left thalamic infarcts.  Remote infarct left lenticular nucleus/ caudate. Remote small cerebellar infarcts. Small vessel disease type changes.  No intracranial hemorrhage.  Atrophy without hydrocephalus.  No intracranial mass lesion noted on this unenhanced exam.  Abnormal appearance of the posterior circulation.  Please see below.  Paranasal sinus mucosal thickening/ partial opacification.  MRA HEAD FINDINGS  Lack of flow in the distal basilar artery, superior cerebral arteries, portions of the posterior cerebral arteries and anterior inferior cerebellar  arteries.  Narrowing of the vertebral arteries greater on the left.  Poor delineation of posterior inferior cerebellar arteries.  Mild narrowing and irregularity of the cavernous segment and supraclinoid segment of the internal carotid arteries bilaterally.  Mild narrowing and irregularity of the M1 segment of the middle cerebral artery bilaterally greater on the right. Mild narrowing of the middle cerebral artery bifurcation greater on right.  Mild narrowing and irregularity middle cerebral artery and anterior cerebral artery branches bilaterally.  Ectatic internal carotid artery distal vertical cervical segment more notable on the right.  No aneurysm noted.  IMPRESSION: MRI HEAD :  Acute bilateral mid to superior cerebellar infarcts greater on the left. Acute pontine infarcts greater on the right. Acute medial left temporal lobe -occipital lobe infarct. Acute small left thalamic infarcts.  Remote infarct left lenticular nucleus/ caudate. Remote small cerebellar infarcts. Small vessel disease type changes.  No intracranial hemorrhage.  Atrophy without hydrocephalus.  Abnormal appearance of the posterior circulation.  Please see below.  Paranasal sinus mucosal thickening/ partial opacification.  MRA HEAD :  Lack of flow in the distal basilar artery, superior cerebral arteries, portions of the posterior cerebral arteries and anterior inferior cerebellar arteries.  Narrowing of the vertebral arteries greater on the left.  Poor delineation of posterior inferior cerebellar arteries.  Less notable atherosclerotic type changes anterior circulation as noted above.  These results were called by telephone at the time of interpretation on 11/30/2013 at 3:37 PM to Dr. Amada JupiterKirkpatrick , who verbally acknowledged these results.   Electronically Signed   By: Bridgett LarssonSteve  Olson M.D.   On: 11/30/2013 16:00   Portable Chest Xray In Am  12/01/2013   CLINICAL DATA:  Endotracheal tube placement.  EXAM: PORTABLE CHEST - 1 VIEW  COMPARISON:   One-view chest 11/30/2013.  FINDINGS: Endotracheal tube remains low, 1.5 cm above the carina. It could be retracted 2-3 cm for more optimal positioning. A right IJ line is stable. The patient is significantly rotated to the right. The NG tube courses off the inferior border of the film. Mild bibasilar airspace disease likely reflects atelectasis.  IMPRESSION: 1. The endotracheal tube remains low, 1.5 cm above the carina. The could be retracted 2-3 cm for more optimal positioning. 2. Persistent low lung volumes and mild bibasilar atelectasis.   Electronically Signed   By: Gennette Pachris  Mattern M.D.   On: 12/01/2013 07:43   Dg Chest Port 1 View  11/30/2013   CLINICAL DATA:  Endotracheal tube and central line placement.  EXAM: PORTABLE CHEST - 1 VIEW  COMPARISON:  11/28/2013  FINDINGS: Endotracheal tube tip lies 1.6 cm above the carina. Right internal jugular central venous line tip lies in the lower superior vena cava. No pneumothorax.  Lungs remain clear.  Cardiac silhouette is normal in size.  IMPRESSION: Endotracheal tube and right internal jugular central venous line are well positioned. No pneumothorax. No other change.   Electronically Signed   By: Amie Portland M.D.   On: 11/30/2013 21:24   Dg Abd Portable 1v  12/01/2013   CLINICAL DATA:  An OG tube placement.  EXAM: PORTABLE ABDOMEN - 1 VIEW  COMPARISON:  08/02/2006  FINDINGS: Enteric tube tip is demonstrated at the upper limits of the image and localizes to the upper epigastric region. This indicates the tip is likely in the upper stomach. Bowel gas pattern is normal with scattered gas and stool in the colon and scattered gas in the small bowel. No small or large bowel distention. No radiopaque stones.  IMPRESSION: Enteric tube tip appears to localize to the upper mid stomach region.   Electronically Signed   By: Burman Nieves M.D.   On: 12/01/2013 03:37    Carotid Doppler  Cancelled due to comfort care  2D Echocardiogram  - Left ventricle: The cavity  size was normal. Wall thickness was increased in a pattern of mild LVH. There was mild focal basal hypertrophy of the septum. Systolic function was mildly reduced. The estimated ejection fraction was in the range of 45% to 50%. The study is not technically sufficient to allow evaluation of LV diastolic function. - Left atrium: The atrium was mildly dilated.  EKG  Sinus rhythm with nonspecific T wave changes.  PT/OT Recommendations cancelled due to comfort care  Physical Exam Temp:  [98.5 F (36.9 C)-99.9 F (37.7 C)] 99.9 F (37.7 C) (07/10 1217) Pulse Rate:  [73-126] 97 (07/10 1400) Resp:  [13-23] 14 (07/10 1400) BP: (115-211)/(71-104) 168/85 mmHg (07/10 1400) SpO2:  [97 %-100 %] 100 % (07/10 1400) Arterial Line BP: (132-252)/(60-120) 171/75 mmHg (07/10 1400) FiO2 (%):  [40 %-50 %] 40 % (07/10 1350) Weight:  [152 lb 8.9 oz (69.2 kg)-153 lb 14.4 oz (69.809 kg)] 152 lb 8.9 oz (69.2 kg) (07/09 2100)  General - intubated, no sedation but not respond to voice and not following commands.  Ophthalmologic - not able to see through.  Cardiovascular - Regular rate and rhythm with no murmur.  Lung - bilateral coarse rhonchi  Neuro - coma, not respond to voice or commands, posturing all extremities on pain stimulation. Pupil left 5mm not reactive, right 2mm reactive, weak corneal reflex, positive gag, no doll's eyes, not breathing over the vent (14/min). Reflex diminished and left babinski neg. Right above knee amputation.   ASSESSMENT [name] is a 73 y.o. male was admitted to ICU for posterior circulation ischemic stroke. He has strokes involving b/l cerebellar, pons, midbrain and left occipital region. Significant neurological deficit. Pt has been living in nursing home with multiple comorbidities pt has no living will but after discussion with daughter, son and wife regarding neurological recovery, prognosis and quality of life, they request withdraw care and comfort care . I  agree. Will do once some family members arrive.    A1C 5.3  LDL 48  TREATMENT/PLAN  Will initiate comfort care once family is ready.  Will inform related service about family decision  Comfort care ordered will be put in  I had long discussion with family regarding His stroke, risk factors, prognosis, treatment plan and answered all questions  This patient is critically ill due to multifocal posterior circulation infarct and respiratory failure and at significant risk of neurological worsening, death form brainstem infarct and respiratory failure. This patient's care  requires constant monitoring of vital signs, hemodynamics, respiratory and cardiac monitoring, review of multiple databases, neurological assessment, discussion with family, other specialists and medical decision making of high complexity. I spent 60 minutes of neurocritical care time in the care of this patient.  SIGNED Marvel Plan, MD PhD 12/01/2013 10:53 AM   To contact Stroke Continuity provider, please refer to [email or web address]. After hours, contact General Neurology

## 2013-12-01 NOTE — Progress Notes (Signed)
Chaplain received referral from pt's RN while on the unit. Pt's family at bedside, preparing to withdraw care. Pt's wife said her minister is on her way. Family said they were doing alright and had no emotional needs at this time. Chaplains available for support, please page if needed.   Maurene CapesHillary D Irusta [phone]

## 2013-12-02 NOTE — Progress Notes (Signed)
PULMONARY / CRITICAL CARE MEDICINE   Name: Seth West MRN: [id number] DOB: March 23, 1941    ADMISSION DATE:  11/28/2013 CONSULTATION DATE:  12/19/2013  REFERRING MD :  FPTS PRIMARY SERVICE: FPTS  CHIEF COMPLAINT:  Respiratory failure and CVA  BRIEF PATIENT DESCRIPTION: 73 year old male with PMH of mild dementia and HTN, on 7/7 patient was noted to be very confused when daughter went to see him and give him his BP medications.  Patient was admitted to [facility] under FPTS.  On 12/05/2022 MRI was performed that showed multiple infracts throughout the brain (see MRI).  FPTS was concerned that patient is not protecting his airway and PCCM was called on consultation.  Patient initially full code but transferred to floor 7/10 p extubation as full NCB  SIGNIFICANT EVENTS / STUDIES:  7/7 admission for AMS. 2022/12/05 MRI with diffuse infarcts.  LINES / TUBES: ETT 7/9> 7/10 R IJ TLC 7/9>>> R radial a-line 7/9> 7/10 Foley 7/9>>> OGT 7/9>>>  CULTURES: Urine 7/7> neg   MRSA swab 7/7>>>Neg.  ANTIBIOTICS: None  SUBJECTIVE:  Comfort goals met on ativan/ms drips  VITAL SIGNS: Temp:  [98.8 F (37.1 C)] 98.8 F (37.1 C) (07/11 0356) Pulse Rate:  [97-132] 111 (07/11 0356) Resp:  [3-16] 3 (07/11 0356) BP: (107-183)/(56-85) 107/56 mmHg (07/11 0356) SpO2:  [85 %-100 %] 86 % (07/11 0356) Arterial Line BP: (144-185)/(52-83) 156/52 mmHg (07/10 1700) FiO2 (%):  [40 %] 40 % (07/10 1500) Weight:  [152 lb 11.2 oz (69.264 kg)] 152 lb 11.2 oz (69.264 kg) (07/11 0356) HEMODYNAMICS:   VENTILATOR SETTINGS: Vent Mode:  [-] PRVC FiO2 (%):  [40 %] 40 % Set Rate:  [40 bmp] 40 bmp Vt Set:  [620 mL] 620 mL PEEP:  [5 cmH20] 5 cmH20 Plateau Pressure:  [21 cmH20] 21 cmH20 INTAKE / OUTPUT: Intake/Output     07/10 0701 - 07/11 0700 07/11 0701 - 07/12 0700   I.V. (mL/kg) 271 (3.9)    IV Piggyback     Total Intake(mL/kg) 271 (3.9)    Urine (mL/kg/hr) 335 (0.2)    Total Output 335     Net -64            PHYSICAL  EXAMINATION: General:  Chronically ill appearing male, completely unresponsive. Neuro:  Unresponsive, does not withdraw to pain, breathing and gurgling.  No gag reflex.  Cardiovascular:  RRR, Nl S1/S2, -M/R/G. Lungs:  Transmitted upper airway sounds. Abdomen:  Soft, NT, ND and +BS. Musculoskeletal:  -edema and -tenderness.  R AKA. Skin:  Intact.  LABS:  CBC  Recent Labs Lab 11/29/13 0630 12/15/2013 0605 12/01/13 0445  WBC 9.5 9.9 9.0  HGB 12.4* 12.2* 11.4*  HCT 36.5* 36.1* 33.6*  PLT 184 198 184   Coag's  Recent Labs Lab 12/01/13 0445  INR 1.16   BMET  Recent Labs Lab 11/29/13 0630 12/05/2013 0605 12/01/13 0445  NA 140 138 139  K 4.3 3.8 3.5*  CL 103 101 103  CO2 $Re'24 22 20  'BWk$ BUN $R'12 13 17  'eH$ CREATININE 0.85 0.88 0.91  GLUCOSE 107* 87 74   Electrolytes  Recent Labs Lab 11/29/13 0630 12/19/2013 0605 12/01/13 0445  CALCIUM 9.8 9.8 9.3  MG  --  1.5  --    Sepsis Markers  Recent Labs Lab 11/28/13 1358 11/28/13 1843 11/29/13 0630 12/13/2013 0605 December 04, 2013 1005  LATICACIDVEN 3.1*  --  1.7  --  1.0  PROCALCITON  --  <0.10  --  <0.10  --    ABG  Recent Labs Lab 11/28/13 1334 11/30/13 2127  PHART 7.416 7.446  PCO2ART 39.4 30.7*  PO2ART 68.0* 134.0*   Liver Enzymes  Recent Labs Lab 11/28/13 1226  AST 21  ALT 18  ALKPHOS 102  BILITOT 0.5  ALBUMIN 3.9   Cardiac Enzymes  Recent Labs Lab 11/28/13 1843  TROPONINI <0.30   Glucose  Recent Labs Lab 11/30/13 2137 11/30/13 2353 12/01/13 0319 12/01/13 0838 12/01/13 1215 12/01/13 1245  GLUCAP 113* 86 78 71 60* 132*    Imaging Mr Mra Head Wo Contrast  11/30/2013   CLINICAL DATA:  Diabetic hypertensive patient presenting with altered mental status.  EXAM: MRI HEAD WITHOUT CONTRAST  MRA HEAD WITHOUT CONTRAST  TECHNIQUE: Multiplanar, multiecho pulse sequences of the brain and surrounding structures were obtained without intravenous contrast. Angiographic images of the head were obtained using MRA  technique without contrast.  COMPARISON:  11/28/2013 CT.  No comparison MR.  FINDINGS: MRI HEAD FINDINGS  Acute bilateral mid to superior cerebellar infarcts greater on the left. Acute pontine infarcts greater on the right. Acute medial left temporal lobe -occipital lobe infarct. Acute small left thalamic infarcts.  Remote infarct left lenticular nucleus/ caudate. Remote small cerebellar infarcts. Small vessel disease type changes.  No intracranial hemorrhage.  Atrophy without hydrocephalus.  No intracranial mass lesion noted on this unenhanced exam.  Abnormal appearance of the posterior circulation.  Please see below.  Paranasal sinus mucosal thickening/ partial opacification.  MRA HEAD FINDINGS  Lack of flow in the distal basilar artery, superior cerebral arteries, portions of the posterior cerebral arteries and anterior inferior cerebellar arteries.  Narrowing of the vertebral arteries greater on the left.  Poor delineation of posterior inferior cerebellar arteries.  Mild narrowing and irregularity of the cavernous segment and supraclinoid segment of the internal carotid arteries bilaterally.  Mild narrowing and irregularity of the M1 segment of the middle cerebral artery bilaterally greater on the right. Mild narrowing of the middle cerebral artery bifurcation greater on right.  Mild narrowing and irregularity middle cerebral artery and anterior cerebral artery branches bilaterally.  Ectatic internal carotid artery distal vertical cervical segment more notable on the right.  No aneurysm noted.  IMPRESSION: MRI HEAD :  Acute bilateral mid to superior cerebellar infarcts greater on the left. Acute pontine infarcts greater on the right. Acute medial left temporal lobe -occipital lobe infarct. Acute small left thalamic infarcts.  Remote infarct left lenticular nucleus/ caudate. Remote small cerebellar infarcts. Small vessel disease type changes.  No intracranial hemorrhage.  Atrophy without hydrocephalus.  Abnormal  appearance of the posterior circulation.  Please see below.  Paranasal sinus mucosal thickening/ partial opacification.  MRA HEAD :  Lack of flow in the distal basilar artery, superior cerebral arteries, portions of the posterior cerebral arteries and anterior inferior cerebellar arteries.  Narrowing of the vertebral arteries greater on the left.  Poor delineation of posterior inferior cerebellar arteries.  Less notable atherosclerotic type changes anterior circulation as noted above.  These results were called by telephone at the time of interpretation on 11/30/2013 at 3:37 PM to Dr. Amada Jupiter , who verbally acknowledged these results.   Electronically Signed   By: Bridgett Larsson M.D.   On: 11/30/2013 16:00   Mr Brain Wo Contrast  11/30/2013   CLINICAL DATA:  Diabetic hypertensive patient presenting with altered mental status.  EXAM: MRI HEAD WITHOUT CONTRAST  MRA HEAD WITHOUT CONTRAST  TECHNIQUE: Multiplanar, multiecho pulse sequences of the brain and surrounding structures were obtained without intravenous contrast. Angiographic  images of the head were obtained using MRA technique without contrast.  COMPARISON:  11/28/2013 CT.  No comparison MR.  FINDINGS: MRI HEAD FINDINGS  Acute bilateral mid to superior cerebellar infarcts greater on the left. Acute pontine infarcts greater on the right. Acute medial left temporal lobe -occipital lobe infarct. Acute small left thalamic infarcts.  Remote infarct left lenticular nucleus/ caudate. Remote small cerebellar infarcts. Small vessel disease type changes.  No intracranial hemorrhage.  Atrophy without hydrocephalus.  No intracranial mass lesion noted on this unenhanced exam.  Abnormal appearance of the posterior circulation.  Please see below.  Paranasal sinus mucosal thickening/ partial opacification.  MRA HEAD FINDINGS  Lack of flow in the distal basilar artery, superior cerebral arteries, portions of the posterior cerebral arteries and anterior inferior cerebellar  arteries.  Narrowing of the vertebral arteries greater on the left.  Poor delineation of posterior inferior cerebellar arteries.  Mild narrowing and irregularity of the cavernous segment and supraclinoid segment of the internal carotid arteries bilaterally.  Mild narrowing and irregularity of the M1 segment of the middle cerebral artery bilaterally greater on the right. Mild narrowing of the middle cerebral artery bifurcation greater on right.  Mild narrowing and irregularity middle cerebral artery and anterior cerebral artery branches bilaterally.  Ectatic internal carotid artery distal vertical cervical segment more notable on the right.  No aneurysm noted.  IMPRESSION: MRI HEAD :  Acute bilateral mid to superior cerebellar infarcts greater on the left. Acute pontine infarcts greater on the right. Acute medial left temporal lobe -occipital lobe infarct. Acute small left thalamic infarcts.  Remote infarct left lenticular nucleus/ caudate. Remote small cerebellar infarcts. Small vessel disease type changes.  No intracranial hemorrhage.  Atrophy without hydrocephalus.  Abnormal appearance of the posterior circulation.  Please see below.  Paranasal sinus mucosal thickening/ partial opacification.  MRA HEAD :  Lack of flow in the distal basilar artery, superior cerebral arteries, portions of the posterior cerebral arteries and anterior inferior cerebellar arteries.  Narrowing of the vertebral arteries greater on the left.  Poor delineation of posterior inferior cerebellar arteries.  Less notable atherosclerotic type changes anterior circulation as noted above.  These results were called by telephone at the time of interpretation on 11/30/2013 at 3:37 PM to Dr. Amada Jupiter , who verbally acknowledged these results.   Electronically Signed   By: Bridgett Larsson M.D.   On: 11/30/2013 16:00   Portable Chest Xray In Am  12/01/2013   CLINICAL DATA:  Endotracheal tube placement.  EXAM: PORTABLE CHEST - 1 VIEW  COMPARISON:   One-view chest 11/30/2013.  FINDINGS: Endotracheal tube remains low, 1.5 cm above the carina. It could be retracted 2-3 cm for more optimal positioning. A right IJ line is stable. The patient is significantly rotated to the right. The NG tube courses off the inferior border of the film. Mild bibasilar airspace disease likely reflects atelectasis.  IMPRESSION: 1. The endotracheal tube remains low, 1.5 cm above the carina. The could be retracted 2-3 cm for more optimal positioning. 2. Persistent low lung volumes and mild bibasilar atelectasis.   Electronically Signed   By: Gennette Pac M.D.   On: 12/01/2013 07:43   Dg Chest Port 1 View  11/30/2013   CLINICAL DATA:  Endotracheal tube and central line placement.  EXAM: PORTABLE CHEST - 1 VIEW  COMPARISON:  11/28/2013  FINDINGS: Endotracheal tube tip lies 1.6 cm above the carina. Right internal jugular central venous line tip lies in the lower superior vena cava.  No pneumothorax.  Lungs remain clear.  Cardiac silhouette is normal in size.  IMPRESSION: Endotracheal tube and right internal jugular central venous line are well positioned. No pneumothorax. No other change.   Electronically Signed   By: Lajean Manes M.D.   On: 11/30/2013 21:24   Dg Abd Portable 1v  12/01/2013   CLINICAL DATA:  An OG tube placement.  EXAM: PORTABLE ABDOMEN - 1 VIEW  COMPARISON:  08/02/2006  FINDINGS: Enteric tube tip is demonstrated at the upper limits of the image and localizes to the upper epigastric region. This indicates the tip is likely in the upper stomach. Bowel gas pattern is normal with scattered gas and stool in the colon and scattered gas in the small bowel. No small or large bowel distention. No radiopaque stones.  IMPRESSION: Enteric tube tip appears to localize to the upper mid stomach region.   Electronically Signed   By: Lucienne Capers M.D.   On: 12/01/2013 03:37     CXR: WNL  ASSESSMENT / PLAN:  PULMONARY A: Acute respiratory failure due to inability to  protect his airway. P:   -ext as ncb/ do not reintubate 7/10 > comfortable on floor   CARDIOVASCULAR A: HTN by history.  Target BP per neuro. P:  - Hold all PO meds.    RENAL A:  No active issues. P:      GASTROINTESTINAL A:  Unable to protect airway.    HEMATOLOGIC A:  No active issues.    INFECTIOUS A:  No evidence of active infection.      ENDOCRINE A:  No active issues.     NEUROLOGIC A:  Multiple strokes, likely showering from a cardiac origin. P:    NCB/ neuro signed off 7/11    No fm at bedside this am   Christinia Gully, MD Pulmonary and [place] [phone] After 5:30 PM or weekends, call [phone]

## 2013-12-02 NOTE — Progress Notes (Signed)
Placed suction set up in room for oral suctioning. Coughing a loose cough.

## 2013-12-02 NOTE — Progress Notes (Addendum)
Pt arrived to floor to room 6N29 via stretcher.  Pt unresponsive, respirations 7 p/min.  02 at 2 liters in place.  O2 sats at 86, HR 105, bp 115/78.  No family at bedside at this time.  Oral care done

## 2013-12-02 NOTE — Progress Notes (Signed)
Comfort care planned. Stroke team will sign off. Please call if we can be of further service.  Delton Seeavid Markeith Jue PA-C [facility] Pager [phone] 12/02/2013, 9:13 AM

## 2013-12-02 NOTE — Progress Notes (Signed)
Transferred pt to 6N with no incident. Pt remains on comfort care.

## 2013-12-02 NOTE — Plan of Care (Signed)
Problem: Phase III Progression Outcomes Goal: Discharge plan remains appropriate-arrangements made Outcome: Not Progressing Patient is now [facility] of Life [facility]

## 2013-12-03 NOTE — Progress Notes (Signed)
PULMONARY / CRITICAL CARE MEDICINE   Name: Seth West MRN: [id number] DOB: 1940-12-16    ADMISSION DATE:  11/28/2013 CONSULTATION DATE:  09-Dec-2013  REFERRING MD :  FPTS PRIMARY SERVICE: FPTS  CHIEF COMPLAINT:  Respiratory failure and CVA  BRIEF PATIENT DESCRIPTION: 73 year old male with PMH of mild dementia and HTN, on 7/7 patient was noted to be very confused when daughter went to see him and give him his BP medications.  Patient was admitted to [facility] under FPTS.  On 12/10/2022 MRI was performed that showed multiple infracts throughout the brain (see MRI).  FPTS was concerned that patient is not protecting his airway and PCCM was called on consultation.  Patient initially full code but transferred to floor 7/10 p extubation as full NCB  SIGNIFICANT EVENTS / STUDIES:  7/7 admission for AMS. December 10, 2022 MRI with diffuse infarcts.  LINES / TUBES: ETT 7/9> 7/10 R IJ TLC 7/9>>> R radial a-line 7/9> 7/10 Foley 7/9>>> OGT 7/9>>>  CULTURES: Urine 7/7> neg   MRSA swab 7/7>>>Neg.  ANTIBIOTICS: None  SUBJECTIVE:  Comfort goals met on ativan/ms drips  VITAL SIGNS: Temp:  [102.9 F (39.4 C)] 102.9 F (39.4 C) (07/12 0545) Pulse Rate:  [114] 114 (07/12 0545) Resp:  [12] 12 (07/12 0545) BP: (107)/(47) 107/47 mmHg (07/12 0545) SpO2:  [91 %] 91 % (07/12 0545) HEMODYNAMICS:   VENTILATOR SETTINGS:   INTAKE / OUTPUT: Intake/Output     07/11 0701 - 07/12 0700 07/12 0701 - 07/13 0700   I.V. (mL/kg)     Total Intake(mL/kg)     Urine (mL/kg/hr) 300 (0.2)    Total Output 300     Net -300 0          PHYSICAL EXAMINATION: General:  Chronically ill appearing male, completely unresponsive. Neuro:  Unresponsive, does not withdraw to pain, breathing and gurgling.  No gag reflex.  Cardiovascular:  RRR, Nl S1/S2, -M/R/G. Lungs:  Transmitted upper airway sounds. Abdomen:  Soft, NT, ND and +BS. Musculoskeletal:  -edema and -tenderness.  R AKA. Skin:  Intact.  LABS:  CBC  Recent Labs Lab  11/29/13 0630 December 09, 2013 0605 12/01/13 0445  WBC 9.5 9.9 9.0  HGB 12.4* 12.2* 11.4*  HCT 36.5* 36.1* 33.6*  PLT 184 198 184   Coag's  Recent Labs Lab 12/01/13 0445  INR 1.16   BMET  Recent Labs Lab 11/29/13 0630 12/07/2013 0605 12/01/13 0445  NA 140 138 139  K 4.3 3.8 3.5*  CL 103 101 103  CO2 $Re'24 22 20  'NBu$ BUN $R'12 13 17  'rh$ CREATININE 0.85 0.88 0.91  GLUCOSE 107* 87 74   Electrolytes  Recent Labs Lab 11/29/13 0630 12/18/2013 0605 12/01/13 0445  CALCIUM 9.8 9.8 9.3  MG  --  1.5  --    Sepsis Markers  Recent Labs Lab 11/28/13 1358 11/28/13 1843 11/29/13 0630 12/17/2013 0605  1005  LATICACIDVEN 3.1*  --  1.7  --  1.0  PROCALCITON  --  <0.10  --  <0.10  --    ABG  Recent Labs Lab 11/28/13 1334 09-Dec-2013 2127  PHART 7.416 7.446  PCO2ART 39.4 30.7*  PO2ART 68.0* 134.0*   Liver Enzymes  Recent Labs Lab 11/28/13 1226  AST 21  ALT 18  ALKPHOS 102  BILITOT 0.5  ALBUMIN 3.9   Cardiac Enzymes  Recent Labs Lab 11/28/13 1843  TROPONINI <0.30   Glucose  Recent Labs Lab 11/30/2013 2137 Dec 09, 2013 2353 12/01/13 0319 12/01/13 0838 12/01/13 1215 12/01/13 1245  GLUCAP 113*  86 78 71 60* 132*    Imaging No results found.   CXR: WNL  ASSESSMENT / PLAN:  PULMONARY A: Acute respiratory failure due to inability to protect his airway. P:   -ext as ncb/ do not reintubate 7/10 > comfortable on floor   CARDIOVASCULAR A: HTN by history.  Target BP per neuro. P:  - Hold all PO meds.    RENAL A:  No active issues. P:      GASTROINTESTINAL A:  Unable to protect airway.    HEMATOLOGIC A:  No active issues.    INFECTIOUS A:  No evidence of active infection.      ENDOCRINE A:  No active issues.     NEUROLOGIC A:  Multiple strokes, likely showering from a cardiac origin. P:    NCB/ neuro signed off 7/11    No fm at bedside this am   Christinia Gully, MD Pulmonary and [place]  [phone] After 5:30 PM or weekends, call [phone]

## 2013-12-03 NOTE — Progress Notes (Signed)
Call placed to Dr. Sherene SiresWert about fever 102.6 Axillary and no PRN medication for fever. No order given. Trina Aoarla Nash Bolls, RN

## 2013-12-04 ENCOUNTER — Inpatient Hospital Stay (HOSPITAL_COMMUNITY)
Admission: AD | Admit: 2013-12-04 | Discharge: 2013-12-23 | DRG: 064 | Disposition: E | Source: Ambulatory Visit | Attending: Critical Care Medicine | Admitting: Critical Care Medicine

## 2013-12-04 DIAGNOSIS — F0390 Unspecified dementia without behavioral disturbance: Secondary | ICD-10-CM | POA: Diagnosis present

## 2013-12-04 DIAGNOSIS — Z66 Do not resuscitate: Secondary | ICD-10-CM | POA: Diagnosis present

## 2013-12-04 DIAGNOSIS — R40243 Glasgow coma scale score 3-8, unspecified time: Secondary | ICD-10-CM

## 2013-12-04 DIAGNOSIS — I6350 Cerebral infarction due to unspecified occlusion or stenosis of unspecified cerebral artery: Principal | ICD-10-CM | POA: Diagnosis present

## 2013-12-04 DIAGNOSIS — J9601 Acute respiratory failure with hypoxia: Secondary | ICD-10-CM

## 2013-12-04 DIAGNOSIS — Z515 Encounter for palliative care: Secondary | ICD-10-CM

## 2013-12-04 DIAGNOSIS — Z79899 Other long term (current) drug therapy: Secondary | ICD-10-CM

## 2013-12-04 DIAGNOSIS — I10 Essential (primary) hypertension: Secondary | ICD-10-CM | POA: Diagnosis present

## 2013-12-04 DIAGNOSIS — S78119A Complete traumatic amputation at level between unspecified hip and knee, initial encounter: Secondary | ICD-10-CM

## 2013-12-04 DIAGNOSIS — G9340 Encephalopathy, unspecified: Secondary | ICD-10-CM | POA: Diagnosis present

## 2013-12-04 DIAGNOSIS — I739 Peripheral vascular disease, unspecified: Secondary | ICD-10-CM | POA: Diagnosis present

## 2013-12-04 DIAGNOSIS — Z87891 Personal history of nicotine dependence: Secondary | ICD-10-CM

## 2013-12-04 DIAGNOSIS — J9600 Acute respiratory failure, unspecified whether with hypoxia or hypercapnia: Secondary | ICD-10-CM | POA: Diagnosis present

## 2013-12-04 DIAGNOSIS — R4020 Unspecified coma: Secondary | ICD-10-CM

## 2013-12-04 DIAGNOSIS — I639 Cerebral infarction, unspecified: Secondary | ICD-10-CM | POA: Diagnosis present

## 2013-12-04 LAB — CULTURE, BLOOD (ROUTINE X 2)
Culture: NO GROWTH
Culture: NO GROWTH

## 2013-12-04 MED ORDER — MORPHINE BOLUS VIA INFUSION
5.0000 mg | INTRAVENOUS | Status: DC | PRN
  Filled 2013-12-04: qty 20

## 2013-12-04 MED ORDER — ACETAMINOPHEN 650 MG RE SUPP
650.0000 mg | Freq: Four times a day (QID) | RECTAL | Status: DC | PRN
  Administered 2013-12-05 – 2013-12-08 (×4): 650 mg via RECTAL
  Filled 2013-12-04 (×4): qty 1

## 2013-12-04 MED ORDER — ATROPINE SULFATE 1 % OP SOLN
2.0000 [drp] | Freq: Four times a day (QID) | OPHTHALMIC | Status: DC | PRN
  Administered 2013-12-05 – 2013-12-08 (×6): 2 [drp] via SUBLINGUAL

## 2013-12-04 MED ORDER — MORPHINE SULFATE 10 MG/ML IJ SOLN
10.0000 mg/h | INTRAMUSCULAR | Status: DC
  Administered 2013-12-04 – 2013-12-08 (×9): 10 mg/h via INTRAVENOUS
  Filled 2013-12-04 (×10): qty 10

## 2013-12-04 MED ORDER — ACETAMINOPHEN 650 MG RE SUPP
650.0000 mg | Freq: Four times a day (QID) | RECTAL | Status: DC | PRN
  Administered 2013-12-04: 650 mg via RECTAL
  Filled 2013-12-04: qty 1

## 2013-12-04 MED ORDER — ATROPINE SULFATE 1 % OP SOLN
2.0000 [drp] | Freq: Four times a day (QID) | OPHTHALMIC | Status: DC | PRN
  Administered 2013-12-04: 2 [drp] via SUBLINGUAL
  Filled 2013-12-04: qty 2

## 2013-12-04 MED ORDER — LORAZEPAM 2 MG/ML IJ SOLN
5.0000 mg/h | INTRAVENOUS | Status: DC
  Filled 2013-12-04: qty 25

## 2013-12-04 MED ORDER — MORPHINE SULFATE 10 MG/ML IJ SOLN
10.0000 mg/h | INTRAVENOUS | Status: DC
  Filled 2013-12-04: qty 10

## 2013-12-04 MED ORDER — ACETAMINOPHEN 650 MG RE SUPP: 650.0000 mg | Freq: Four times a day (QID) | RECTAL | Status: DC | PRN

## 2013-12-04 MED ORDER — LORAZEPAM 2 MG/ML IJ SOLN
5.0000 mg/h | INTRAVENOUS | Status: DC
  Administered 2013-12-05 – 2013-12-08 (×10): 5 mg/h via INTRAVENOUS
  Filled 2013-12-04 (×9): qty 25

## 2013-12-04 MED ORDER — LORAZEPAM BOLUS VIA INFUSION
2.0000 mg | INTRAVENOUS | Status: DC | PRN
  Administered 2013-12-07: 5 mg via INTRAVENOUS
  Filled 2013-12-04: qty 5

## 2013-12-04 MED ORDER — LORAZEPAM BOLUS VIA INFUSION
2.0000 mg | INTRAVENOUS | Status: DC | PRN
  Filled 2013-12-04: qty 5

## 2013-12-04 NOTE — H&P (Signed)
Name: Seth West MRN: [id number] DOB: 09-17-1940    ADMISSION DATE:  12/06/2013 PRIMARY SERVICE:  PCCM  CHIEF COMPLAINT:  GIP Status, Comfort Care  BRIEF PATIENT DESCRIPTION: 73 y/o M with recent posterior circulation CVA / multiple infarcts.  GIP status as of 7/13 for comfort measures.   SIGNIFICANT EVENTS / STUDIES:  7/13 - d/c with readmit for GIP status   LINES / TUBES: R IJ TLC 7/9 >>    HISTORY OF PRESENT ILLNESS:  73 y.o. y/o male, former smoker, with a PMH of HTN, PVD, Gangrene of R foot and dementia who was admitted to [facility] from SNF on 11/28/13 with altered mental status, slurred speech, and emesis. At baseline prior to admit he was alert and able to hold conversation. 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 He was admitted per FPTS for further evaluation. On 7/9 an MRI was performed which demonstrated multiple infarcts throughout the brain - posterior circulation ischemic stroke, involving b/l cerebellar, pons, midbrain and left occipital region. PCCM was consulted for evaluation given concerns of inability to protect airway. He required intubation on evaluation by PCCM. He was evaluated by Neurology and after further discussion regarding prognosis, patient was made a DNR and family requested comfort measures. Patient was extubated 7/10 after initiation of morphine & ativan gtt's with plan for full comfort measures.  Readmitted 7/13 for GIP status / full comfort measures.  [facility] to follow for outpatient needs.   PAST MEDICAL HISTORY :  Past Medical History  Diagnosis Date  . Hypertension     has been off meds for years.   . Peripheral vascular disease   . 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 Dementia     hx unreliablr pt confused   Past Surgical History  Procedure Laterality Date  . Amputation Right 07/07/2013    Procedure: AMPUTATION BELOW KNEE- RIGHT;  Surgeon: Pryor Ochoa, MD;  Location: [facility] OR;  Service: Vascular;  Laterality: Right;  . Amputation Right 09/06/2013    Procedure: RIGHT ABOVE KNEE AMPUTATION;  Surgeon: Pryor Ochoa, MD;  Location: [facility] OR;  Service: Vascular;  Laterality: Right;   Prior to Admission medications   Medication Sig Start Date End Date Taking? Authorizing Provider  amLODipine (NORVASC) 5 MG tablet Take 5 mg by mouth 2 (two) times daily.   Yes Historical Provider, MD  metoprolol tartrate (LOPRESSOR) 25 MG tablet Take 25 mg by mouth 2 (two) times daily.   Yes Historical Provider, MD  oxyCODONE (OXY IR/ROXICODONE) 5 MG immediate release tablet Take 5 mg by mouth every 4 (four) hours as needed for severe pain.   Yes Historical Provider, MD  Vitamin D, Ergocalciferol, (DRISDOL) 50000 UNITS CAPS capsule Take 50,000 Units by mouth every 7 (seven) days.   Yes Historical Provider, MD   No Known Allergies  FAMILY HISTORY:  Family History  Problem Relation Age of Onset  . Hypertension Mother   . Hypertension Father    SOCIAL HISTORY:  reports that he quit smoking about 6 months ago. His smoking use included Cigarettes. He has a 150 pack-year smoking history. He has never used smokeless tobacco. He reports that he drinks alcohol. He reports that he does not use illicit drugs.  REVIEW OF SYSTEMS:  Unable to complete as patient is altered / comfort care.   SUBJECTIVE:   VITAL SIGNS: Temp:  [102.8 F (  39.3 C)] 102.8 F (39.3 C) (07/13 0549) Pulse Rate:  [118] 118 (07/13 0549) Resp:  [5] 5 (07/13 0549) BP: (125)/(58) 125/58 mmHg (07/13 0549) SpO2:  [85 %] 85 % (07/13 0549)  PHYSICAL EXAMINATION: General: Chronically ill appearing male, completely unresponsive.  Neuro: Unresponsive, does not withdraw to pain, breathing and gurgling. No gag reflex.  Cardiovascular: RRR, Nl S1/S2, -M/R/G.  Lungs: Transmitted upper airway sounds.  Abdomen: Soft, NT, ND and +BS.  Musculoskeletal: -edema and -tenderness. R AKA.  Skin:  Intact   Recent Labs Lab 11/29/13 0630 11/30/13 0605 12/01/13 0445  NA 140 138 139  K 4.3 3.8 3.5*  CL 103 101 103  CO2 24 22 20   BUN 12 13 17   CREATININE 0.85 0.88 0.91  GLUCOSE 107* 87 74    Recent Labs Lab 11/29/13 0630 11/30/13 0605 12/01/13 0445  HGB 12.4* 12.2* 11.4*  HCT 36.5* 36.1* 33.6*  WBC 9.5 9.9 9.0  PLT 184 198 184   No results found.  ASSESSMENT / PLAN:  Posterior Circulation CVA / Multiple Infarcts  Acute Encephalopathy  Hypertension  Acute Respiratory Failure  DNR   Discharge Plan:  -Continue ativan & morphine infusion  [facility]  -DNR  -PRN atropine gtt's SL for increased secretions   Canary BrimBrandi Ollis, NP-C [place] [facility] Pgr: [phone] or [phone]  I agree with the above [name]  Caryl Bisatrick WrightMD Beeper  [phone]  Cell  [phone]  If no response or cell goes to voicemail, call beeper [phone]  12/22/2013, 8:24 PM

## 2013-12-04 NOTE — Progress Notes (Addendum)
PULMONARY / CRITICAL CARE MEDICINE   Name: Seth West MRN: [id number] DOB: [birth year]    ADMISSION DATE:  11/28/2013 CONSULTATION DATE:  December 05, 2013  REFERRING MD :  FPTS PRIMARY SERVICE: FPTS  CHIEF COMPLAINT:  Respiratory failure and CVA  BRIEF PATIENT DESCRIPTION: 73 year old male with PMH of mild dementia and HTN, on 7/7 patient was noted to be very confused when daughter went to see him and give him his BP medications.  Patient was admitted to [facility] under FPTS.  On 12-06-22 MRI was performed that showed multiple infracts throughout the brain (see MRI).  FPTS was concerned that patient is not protecting his airway and PCCM was called on consultation.  Patient initially full code but transferred to floor 7/10 p extubation as full NCB  SIGNIFICANT EVENTS / STUDIES:  7/7 admission for AMS. 2022/12/06 MRI with diffuse infarcts.  LINES / TUBES: ETT 7/9> 7/10 R IJ TLC 7/9>>> R radial a-line 7/9> 7/10 Foley 7/9>>> OGT 7/9>>>7/11  CULTURES: Urine 7/7> neg   MRSA swab 7/7>>>Neg.  ANTIBIOTICS: None  SUBJECTIVE:  Comfort goals met on ativan/ms drips Consider gip  VITAL SIGNS: Temp:  [102.6 F (39.2 C)-102.8 F (39.3 C)] 102.8 F (39.3 C) (07/13 0549) Pulse Rate:  [118] 118 (07/13 0549) Resp:  [5] 5 (07/13 0549) BP: (125)/(58) 125/58 mmHg (07/13 0549) SpO2:  [85 %] 85 % (07/13 0549)   INTAKE / OUTPUT: Intake/Output     07/12 0701 - 07/13 0700 07/13 0701 - 07/14 0700   I.V. (mL/kg) 110 (1.6)    Total Intake(mL/kg) 110 (1.6)    Urine (mL/kg/hr) 175 (0.1)    Total Output 175     Net -65            PHYSICAL EXAMINATION: General:  Chronically ill appearing male, completely unresponsive. Neuro:  Unresponsive, does not withdraw to pain, breathing and gurgling.  No gag reflex.  Cardiovascular:  RRR, Nl S1/S2, -M/R/G. Lungs:  Transmitted upper airway sounds. Abdomen:  Soft, NT, ND and +BS. Musculoskeletal:  -edema and -tenderness.  R AKA. Skin:  Intact.  LABS:  CBC  Recent  Labs Lab 11/29/13 0630 12-05-13 0605 12/01/13 0445  WBC 9.5 9.9 9.0  HGB 12.4* 12.2* 11.4*  HCT 36.5* 36.1* 33.6*  PLT 184 198 184   Coag's  Recent Labs Lab 12/01/13 0445  INR 1.16   BMET  Recent Labs Lab 11/29/13 0630 December 05, 2013 0605 12/01/13 0445  NA 140 138 139  K 4.3 3.8 3.5*  CL 103 101 103  CO2 _0 BUN _1 CREATININE 0.85 0.88 0.91  GLUCOSE 107* 87 74   Electrolytes  Recent Labs Lab 11/29/13 0630 2013/12/05 0605 12/01/13 0445  CALCIUM 9.8 9.8 9.3  MG  --  1.5  --    Sepsis Markers  Recent Labs Lab 11/28/13 1358 11/28/13 1843 11/29/13 0630 12-05-13 0605 2013/12/05 1005  LATICACIDVEN 3.1*  --  1.7  --  1.0  PROCALCITON  --  <0.10  --  <0.10  --    ABG  Recent Labs Lab 11/28/13 1334 12-05-2013 2127  PHART 7.416 7.446  PCO2ART 39.4 30.7*  PO2ART 68.0* 134.0*   Liver Enzymes  Recent Labs Lab 11/28/13 1226  AST 21  ALT 18  ALKPHOS 102  BILITOT 0.5  ALBUMIN 3.9   Cardiac Enzymes  Recent Labs Lab 11/28/13 1843  TROPONINI <0.30   Glucose  Recent Labs Lab 12-05-2013 2137 12-05-13 2353 12/01/13 0319 12/01/13 0838 12/01/13 1215 12/01/13 1245  GLUCAP 113* 86 78 71 60* 132*    Imaging No results found.     ASSESSMENT / PLAN: Principal Problem:   Comfort measures only status Active Problems:   HYPERTENSION   Peripheral vascular disease   Coma   Acute respiratory failure   CVA (cerebral vascular accident)   Altered mental status   DNR (do not resuscitate)   PULMONARY A: Acute respiratory failure  P:   -comfort care only   CARDIOVASCULAR A: HTN by history.  Target BP per neuro. P:  - Hold all PO meds.     NEUROLOGIC A:  Multiple strokes, likely showering from a cardiac origin. COMA  GCS 3 P:    NCB/ neuro signed off 7/11 Comfort care only    No fm at bedside this am  Look into GIP status  Mariel Sleet Beeper  [phone]  Cell  [phone]  If no response or cell goes to voicemail,  call beeper [phone]  12/03/2013 8:58 AM

## 2013-12-04 NOTE — H&P (Deleted)
Name: Seth West MRN: [id number] DOB: Jan 09, 1941    ADMISSION DATE:  11/28/2013 PRIMARY SERVICE:  PCCM  CHIEF COMPLAINT:  GIP Status, Comfort Care  BRIEF PATIENT DESCRIPTION: 73 y/o M with recent posterior circulation CVA / multiple infarcts.  GIP status as of 7/13 for comfort measures.   SIGNIFICANT EVENTS / STUDIES:  7/13 - d/c with readmit for GIP status   LINES / TUBES: R IJ TLC 7/9 >>    HISTORY OF PRESENT ILLNESS:  73 y.o. y/o male, former smoker, with a PMH of HTN, PVD, Gangrene of R foot and dementia who was admitted to [facility] from SNF on 11/28/13 with altered mental status, slurred speech, and emesis. At baseline prior to admit he was alert and able to hold conversation. 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 He was admitted per FPTS for further evaluation. On 7/9 an MRI was performed which demonstrated multiple infarcts throughout the brain - posterior circulation ischemic stroke, involving b/l cerebellar, pons, midbrain and left occipital region. PCCM was consulted for evaluation given concerns of inability to protect airway. He required intubation on evaluation by PCCM. He was evaluated by Neurology and after further discussion regarding prognosis, patient was made a DNR and family requested comfort measures. Patient was extubated 7/10 after initiation of morphine & ativan gtt's with plan for full comfort measures.  Readmitted 7/13 for GIP status / full comfort measures.  [facility] to follow for outpatient needs.   PAST MEDICAL HISTORY :  Past Medical History  Diagnosis Date  . Hypertension     has been off meds for years.   . Peripheral vascular disease   . 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 Dementia     hx unreliablr pt confused   Past Surgical History  Procedure Laterality Date  . Amputation Right 07/07/2013    Procedure: AMPUTATION BELOW KNEE- RIGHT;  Surgeon: Pryor Ochoa, MD;  Location: [facility] OR;  Service: Vascular;  Laterality: Right;  . Amputation Right 09/06/2013    Procedure: RIGHT ABOVE KNEE AMPUTATION;  Surgeon: Pryor Ochoa, MD;  Location: [facility] OR;  Service: Vascular;  Laterality: Right;   Prior to Admission medications   Medication Sig Start Date End Date Taking? Authorizing Provider  amLODipine (NORVASC) 5 MG tablet Take 5 mg by mouth 2 (two) times daily.   Yes Historical Provider, MD  metoprolol tartrate (LOPRESSOR) 25 MG tablet Take 25 mg by mouth 2 (two) times daily.   Yes Historical Provider, MD  oxyCODONE (OXY IR/ROXICODONE) 5 MG immediate release tablet Take 5 mg by mouth every 4 (four) hours as needed for severe pain.   Yes Historical Provider, MD  Vitamin D, Ergocalciferol, (DRISDOL) 50000 UNITS CAPS capsule Take 50,000 Units by mouth every 7 (seven) days.   Yes Historical Provider, MD   No Known Allergies  FAMILY HISTORY:  Family History  Problem Relation Age of Onset  . Hypertension Mother   . Hypertension Father    SOCIAL HISTORY:  reports that he quit smoking about 6 months ago. His smoking use included Cigarettes. He has a 150 pack-year smoking history. He has never used smokeless tobacco. He reports that he drinks alcohol. He reports that he does not use illicit drugs.  REVIEW OF SYSTEMS:  Unable to complete as patient is altered / comfort care.   SUBJECTIVE:   VITAL SIGNS: Temp:  [102.8 F (  39.3 C)] 102.8 F (39.3 C) (07/13 0549) Pulse Rate:  [118] 118 (07/13 0549) Resp:  [5] 5 (07/13 0549) BP: (125)/(58) 125/58 mmHg (07/13 0549) SpO2:  [85 %] 85 % (07/13 0549)  PHYSICAL EXAMINATION: General: Chronically ill appearing male, completely unresponsive.  Neuro: Unresponsive, does not withdraw to pain, breathing and gurgling. No gag reflex.  Cardiovascular: RRR, Nl S1/S2, -M/R/G.  Lungs: Transmitted upper airway sounds.  Abdomen: Soft, NT, ND and +BS.  Musculoskeletal: -edema and -tenderness. R AKA.  Skin:  Intact   Recent Labs Lab 11/29/13 0630 11/30/13 0605 12/01/13 0445  NA 140 138 139  K 4.3 3.8 3.5*  CL 103 101 103  CO2 24 22 20   BUN 12 13 17   CREATININE 0.85 0.88 0.91  GLUCOSE 107* 87 74    Recent Labs Lab 11/29/13 0630 11/30/13 0605 12/01/13 0445  HGB 12.4* 12.2* 11.4*  HCT 36.5* 36.1* 33.6*  WBC 9.5 9.9 9.0  PLT 184 198 184   No results found.  ASSESSMENT / PLAN:  Posterior Circulation CVA / Multiple Infarcts  Acute Encephalopathy  Hypertension  Acute Respiratory Failure  DNR   Discharge Plan:  -Continue ativan & morphine infusion  [facility]  -DNR  -PRN atropine gtt's SL for increased secretions   Canary BrimBrandi Ollis, NP-C [place] [facility] Pgr: [phone] or [phone]    11/24/2013, 12:54 PM

## 2013-12-04 NOTE — Discharge Summary (Signed)
Physician Discharge Summary  Patient ID: Seth West MRN: [id number] DOB/AGE: Aug 02, 1940 73 y.o.  Admit date: 11/28/2013 Discharge date: 12/22/2013    Discharge Diagnoses:  Multiple CVA  Acute Encephalopathy Hypertension  Acute Respiratory Failure DNR                                                                      DISCHARGE PLAN BY DIAGNOSIS     Posterior Circulation CVA / Multiple Infarcts Acute Encephalopathy Hypertension  Acute Respiratory Failure DNR   Discharge Plan: -Continue ativan & morphine infusion -[place] to follow  -DNR  -PRN atropine gtt's SL for increased secretions                  DISCHARGE SUMMARY   Seth West is a 73 y.o. y/o male, former smoker,  with a PMH of HTN, PVD, Gangrene of R foot and dementia who was admitted to [facility] from SNF on 11/28/13 with altered mental status, slurred speech, and emesis.  At baseline prior to admit he was alert and able to hold conversation.  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  He was admitted per FPTS for further evaluation.  On 7/9 an MRI was performed which demonstrated multiple infarcts throughout the brain - posterior circulation ischemic stroke, involving b/l cerebellar, pons, midbrain and left occipital region.  PCCM was consulted for evaluation given concerns of inability to protect airway.  He required intubation on evaluation by PCCM.   He was evaluated by Neurology and after further discussion regarding prognosis, patient was made a DNR and family requested comfort measures.  Patient was extubated after initiation of morphine & ativan gtt's with plan for full comfort measures.      SIGNIFICANT EVENTS / STUDIES:  7/07 - admission for AMS.  7/09 - MRI with diffuse infarcts.  7/13 - Discharge, readmit for GIP status   LINES / TUBES:  ETT 7/9> 7/10  R IJ TLC 7/9>>>  R radial a-line 7/9> 7/10  Foley 7/9>>>  OGT  7/9>>>7/11   CULTURES:  Urine 7/7> neg  MRSA swab 7/7>>>Neg.   Discharge Exam: General: Chronically ill appearing male, completely unresponsive.  Neuro: Unresponsive, does not withdraw to pain, breathing and gurgling. No gag reflex.  Cardiovascular: RRR, Nl S1/S2, -M/R/G.  Lungs: Transmitted upper airway sounds.  Abdomen: Soft, NT, ND and +BS.  Musculoskeletal: -edema and -tenderness. R AKA.  Skin: Intact   Filed Vitals:   12/02/13 0356 12/03/13 0545 12/03/13 1048 11/30/2013 0549  BP: 107/56 107/47  125/58  Pulse: 111 114  118  Temp: 98.8 F (37.1 C) 102.9 F (39.4 C) 102.6 F (39.2 C) 102.8 F (39.3 C)  TempSrc: Axillary Axillary Axillary Axillary  Resp: $Remo'3 12  5  'ikoLP$ Height:      Weight: 152 lb 11.2 oz (69.264 kg)     SpO2: 86% 91%  85%     Discharge Labs  BMET  Recent Labs Lab 11/28/13 1226 11/29/13 0630 11/30/13 0605 12/01/13 0445  NA 141 140 138 139  K 4.4 4.3 3.8 3.5*  CL 102 103 101 103  CO2 $Re'26 24 22 20  'UYb$ GLUCOSE 123* 107* 87 74  BUN '17 12 13 17  '$ CREATININE 0.88 0.85 0.88 0.91  CALCIUM 10.1 9.8 9.8 9.3  MG  --   --  1.5  --    CBC  Recent Labs Lab 11/29/13 0630 11/30/13 0605 12/01/13 0445  HGB 12.4* 12.2* 11.4*  HCT 36.5* 36.1* 33.6*  WBC 9.5 9.9 9.0  PLT 184 198 184   Anti-Coagulation  Recent Labs Lab 12/01/13 0445  INR 1.16      Medication List    STOP taking these medications       amLODipine 5 MG tablet  Commonly known as:  NORVASC     metoprolol tartrate 25 MG tablet  Commonly known as:  LOPRESSOR     oxyCODONE 5 MG immediate release tablet  Commonly known as:  Oxy IR/ROXICODONE     Vitamin D (Ergocalciferol) 50000 UNITS Caps capsule  Commonly known as:  DRISDOL        CURRENT INFUSIONS . LORazepam (ATIVAN) infusion    . morphine 10 mg/hr (11/28/2013 1108)     Disposition:  Seth West GIP admission for ongoing hospice care.    Discharged Condition: Seth West has met maximum benefit of inpatient care and is  medically stable and cleared for discharge.  Patient is pending follow up as above.      Time spent on disposition:  Greater than 35 minutes.   Signed: Noe Gens, NP-C [facility] Pgr: [phone] Office: [phone]    Agree with above Mariel Sleet

## 2013-12-04 NOTE — Progress Notes (Signed)
Notified by [facility]) SW Forrestine HimEva Davis of referral for evaluation of GIP level of care eligibility. Pt information [facility] Medical Director Dr. Mikle Boswortharlos Monguilod. Pt admitted to GIP level of care for terminal DX: CVA (434.91). Pt is DNR code status. Pt admitted on 7/7 with AMS, unable to communicate, change from his baseline just the day before as reported by his daughter. MRI on 7/9 showed several infarcts, pt intubated on 7/9 due to inability to protect his airway. After discussion with family, pt was extubated on 7/10 and transitioned to comfort care. Pt seen at bedside, lying in bed, respirations short, shallow, regular. Continuous morphine at 10mg /hr infusing to manage pain, lorazepam infusing at 5mg /hr for management of anxiety. No response to touch or voice, facial muscles appear relaxed. Skin very warm. Pt's wife at bedside, expressed understanding of hospice services as explained by [facility] SW Forrestine HimEva Davis. She stated her main concern was that "he just doesn't hurt, and is comfortable". Medications being given discussed briefly. She felt pt was comfortable during visit. She also explained that she and the patient had been separated for "a long time" but that she still cared for him. Patient's daughter Delray AltMargie was not present during writers visit, patient's wife shared that she would be returning shortly. Writer advised patient's wife that [facility] team would be collaborating with Attending MD Dr. Shan LevansPatrick Wright regarding plan for comfort. Emotional support offered.  Please call [facility] [phone] with any needs and at time of death. Dayna BarkerKaren Robertson RN, [facility] Hospital liaison

## 2013-12-05 NOTE — H&P (Signed)
i agree with this [name] Seth LevansPatrick Nikyah Lackman

## 2013-12-05 NOTE — Progress Notes (Addendum)
Inpatient RN visit- Verda [facility]iel [facility] RN Visit-Karen Merilynn FinlandRobertson RN  [facility] diagnosis of  CVA.  Pt is DNR  code.  Pt seen at bedside, lying on his left side. Audible secretions noted. Staff Rn notified and came to the room to perform oral suction and give atropine gtts as ordered. Pt had no response to oral suctioning. Remains on continuous morphine infusion at 10mg /hr for pain management, current dose appears effective, no s/s of pain or discomfort. Pt also remains on a continuous lorazepam infusion at 5mg /hr. Skin warm to touch, per staff Rn patient had increased temp of 100.8 and was given tylenol suppository as ordered. 20-25 sec periods of apnea observed during visit. No family present at time of visit.  [facility] will continue to collaborate with staff and attending Md Dr. Delford FieldWright r/t symptom management and patient needs through end of life.  Please call [facility] @ [phone]  with any hospice needs or at time of death Thank you. Hansel StarlingKaren E. Robertson, RN  [facility]  Hospice Liaison  [phone])

## 2013-12-05 NOTE — Progress Notes (Signed)
[facility] Social Work Note:  Related admission to [facility] diagnosis of CVA.  Met with patient's daughter, son and spouse at bedside. Family pleased with care on unit and mentioned patient's breathing appears more shallow today. They continue to express how unexpected CVA was and how they are coming to terms with approaching end of life. They are sad but excepting, adjusting to change in plans, that he will not continue to rehab and return home. Daughter is CNA, very attentive to patient who appears comfortable. Son shared patient has four grands and one greatgrand. Provided them with [employer] booklets and encouraged contact. Spouse expressed appreciation for comfortable surroundings in hospital where they can be together for daily visits. Family aware [facility] team follows daily with attending. Reminded family of bereavement support available through [facility]. Please do not hesitate to contact me for support to this family. Thank you. Erling Conte LCSW [phone]   Please call [facility] at [phone] with hospice needs or at time of death.

## 2013-12-05 NOTE — Progress Notes (Signed)
Pt. Requested visit from Chaplain. Chaplain visited pt, but pt has been in a coma for several days, but family members were in attendance. Family members requested prayer for them and pt. Chaplain prayed for family members and pt. And encouraged them to contact Spiritual Care dept if further services were needed.   Cindie CrumblyBeverley Norberta Stobaugh, [address]

## 2013-12-05 NOTE — H&P (Addendum)
   Name: Seth West MRN: [id number] DOB: 1941-02-05    ADMISSION DATE:  12/11/2013 PRIMARY SERVICE:  PCCM  CHIEF COMPLAINT:  GIP Status, Comfort Care  BRIEF PATIENT DESCRIPTION: 73 y/o M with recent posterior circulation CVA / multiple infarcts.  GIP status as of 7/13 for comfort measures.   SIGNIFICANT EVENTS / STUDIES:  7/13 - d/c with readmit for GIP status   LINES / TUBES: R IJ TLC 7/9 >>   SUBJECTIVE:  End seems near Agonal respirs  VITAL SIGNS: Temp:  [100.2 F (37.9 C)] 100.2 F (37.9 C) (07/14 0534) Pulse Rate:  [129] 129 (07/14 0534) Resp:  [6] 6 (07/14 0534) BP: (118)/(58) 118/58 mmHg (07/14 0534) SpO2:  [95 %] 95 % (07/14 0534)  PHYSICAL EXAMINATION: General: Chronically ill appearing male, completely unresponsive.  Neuro: Unresponsive, does not withdraw to pain, breathing and gurgling. No gag reflex.  Cardiovascular: RRR, Nl S1/S2, -M/R/G.  Lungs: Transmitted upper airway sounds. Agonal respirs Abdomen: Soft, NT, ND and +BS.  Musculoskeletal: -edema and -tenderness. R AKA.  Skin: Intact   Recent Labs Lab 11/29/13 0630 11/30/13 0605 12/01/13 0445  NA 140 138 139  K 4.3 3.8 3.5*  CL 103 101 103  CO2 24 22 20   BUN 12 13 17   CREATININE 0.85 0.88 0.91  GLUCOSE 107* 87 74    Recent Labs Lab 11/29/13 0630 11/30/13 0605 12/01/13 0445  HGB 12.4* 12.2* 11.4*  HCT 36.5* 36.1* 33.6*  WBC 9.5 9.9 9.0  PLT 184 198 184   No results found.  ASSESSMENT / PLAN:  Posterior Circulation CVA / Multiple Infarcts  COMA COMFORT CARE ONLY  Hypertension  Acute Respiratory Failure  DNR   Plan:  -Continue ativan & morphine infusion  [facility]  -DNR  -PRN atropine gtt's SL for increased secretions -end is near     Caryl Bisatrick WrightMD Beeper  [phone]  Cell  [phone]  If no response or cell goes to voicemail, call beeper [phone]  12/05/2013, 7:45 AM

## 2013-12-05 NOTE — Progress Notes (Signed)
Nutrition Brief Note  Chart reviewed. Pt now transitioning to comfort care.  No further nutrition interventions warranted at this time.  Please re-consult as needed.   Laurieanne Galloway MS, RD, LDN Inpatient Registered Dietitian Pager: [phone] After-hours pager: [phone]    

## 2013-12-06 NOTE — Progress Notes (Signed)
Inpatient RN [facility]- [facility] Visit-Karen Merilynn FinlandRobertson RN  [facility].  Pt is DNR code.  Pt seen at bedside, HOB up. Pt with short regular breaths, 8/minute. Mouth care performed, pt did react to swab by closing his mouth. Odor noted around pt, addressed with staff RN Delice Bisonara and tech Dorisann Framesonia, pt to be bathed this am, he has been bathed on a  regular basis, but has occasional diaphoresis with fevers. Staff very attentive to patient needs. Patient continues on a continuous morphine infusion at 10mg /hr and lorazepam continuous at 5mg /hr for management of pain and anxiety. Pt has received both PRN acetaminophen for fever and atropine for management of terminal secretions. He appears comfortable at this visit. [facility] will continue to collaborate with staff and attending Md Dr. Delford FieldWright r/t symptom management and patient needs through end of life.  No family present at time of visit.  Please call [facility] @ [phone]  with any hospice needs or at time of death.   Thank you. Hansel StarlingKaren E. Robertson, RN  [facility]  Hospice Liaison  [phone])

## 2013-12-06 NOTE — Progress Notes (Signed)
PULMONARY / CRITICAL CARE MEDICINE   Name: Seth West MRN: [id number] DOB: 1941-02-13    ADMISSION DATE:  12/03/2013 CONSULTATION DATE:  2013/12/16  REFERRING MD :  FPTS PRIMARY SERVICE: FPTS  CHIEF COMPLAINT:  Respiratory failure and CVA  BRIEF PATIENT DESCRIPTION: 73 year old male with PMH of mild dementia and HTN, now acute CVA and comfort care GIP status as of 7/13  SIGNIFICANT EVENTS / STUDIES:  7/7 admission for AMS. 12/17/2022 MRI with diffuse infarcts.  LINES / TUBES: ETT 7/9> 7/10 R IJ TLC 7/9>>> R radial a-line 7/9> 7/10 Foley 7/9>>> OGT 7/9>>>7/11  CULTURES: Urine 7/7> neg   MRSA swab 7/7>>>Neg.  ANTIBIOTICS: None  SUBJECTIVE:  Comfort goals met on ativan/ms drips Now in gip  VITAL SIGNS: Temp:  [97.4 F (36.3 C)-98.7 F (37.1 C)] 97.4 F (36.3 C) (07/15 0610) Pulse Rate:  [118-121] 121 (07/15 0610) Resp:  [4-8] 4 (07/15 0610) BP: (115-128)/(60-98) 128/98 mmHg (07/15 0610) SpO2:  [73 %-95 %] 73 % (07/15 0610)   INTAKE / OUTPUT: Intake/Output     07/14 0701 - 07/15 0700 07/15 0701 - 07/16 0700   I.V. 8280    Total Intake 3788     Urine 350    Total Output 350     Net +3438            PHYSICAL EXAMINATION: General:  Chronically ill appearing male, completely unresponsive. Neuro:  Unresponsive, does not withdraw to pain,   No gag reflex.  Cardiovascular:  RRR, Nl S1/S2, -M/R/G. Lungs:  Transmitted upper airway sounds. No gurgling Abdomen:  Soft, NT, ND and +BS. Musculoskeletal:  -edema and -tenderness.  R AKA. Skin:  Intact.  LABS:  CBC  Recent Labs Lab December 16, 2013 0605 12/01/13 0445  WBC 9.9 9.0  HGB 12.2* 11.4*  HCT 36.1* 33.6*  PLT 198 184   Coag's  Recent Labs Lab 12/01/13 0445  INR 1.16   BMET  Recent Labs Lab 12-16-13 0605 12/01/13 0445  NA 138 139  K 3.8 3.5*  CL 101 103  CO2 22 20  BUN 13 17  CREATININE 0.88 0.91  GLUCOSE 87 74   Electrolytes  Recent Labs Lab 12/16/2013 0605 12/01/13 0445  CALCIUM 9.8  9.3  MG 1.5  --    Sepsis Markers  Recent Labs Lab December 16, 2013 0605 12-16-2013 1005  LATICACIDVEN  --  1.0  PROCALCITON <0.10  --    ABG  Recent Labs Lab 12/16/2013 2127  PHART 7.446  PCO2ART 30.7*  PO2ART 134.0*   Liver Enzymes No results found for this basename: AST, ALT, ALKPHOS, BILITOT, ALBUMIN,  in the last 168 hours Cardiac Enzymes No results found for this basename: TROPONINI, PROBNP,  in the last 168 hours Glucose  Recent Labs Lab December 16, 2013 2137 12-16-2013 2353 12/01/13 0319 12/01/13 0838 12/01/13 1215 12/01/13 1245  GLUCAP 113* 86 78 71 60* 132*    Imaging No results found.     ASSESSMENT / PLAN: Principal Problem:   Comfort measures only status Active Problems:   Coma   Acute respiratory failure   CVA (cerebral vascular accident)   DNR (do not resuscitate)   PULMONARY A: Acute respiratory failure  P:   -comfort care only   CARDIOVASCULAR A: HTN by history.  Target BP per neuro. P:  - Hold all PO meds.     NEUROLOGIC A:  Multiple strokes, likely showering from a cardiac origin. COMA  GCS 3 P:    NCB/ neuro signed off 7/11 Comfort  care only    No fm at bedside this am  Cont gip status.  [facility] following End is near    Glyn Ade  [phone]  Cell  [phone]  If no response or cell goes to voicemail, call beeper [phone]  12/06/2013 7:48 AM

## 2013-12-07 DIAGNOSIS — Z515 Encounter for palliative care: Secondary | ICD-10-CM

## 2013-12-07 DIAGNOSIS — J9600 Acute respiratory failure, unspecified whether with hypoxia or hypercapnia: Secondary | ICD-10-CM

## 2013-12-07 DIAGNOSIS — Z66 Do not resuscitate: Secondary | ICD-10-CM

## 2013-12-07 MED ORDER — SODIUM CHLORIDE 0.9 % IJ SOLN
10.0000 mL | INTRAMUSCULAR | Status: DC | PRN
  Administered 2013-12-07 – 2013-12-08 (×2): 20 mL

## 2013-12-07 MED ORDER — GLYCOPYRROLATE 0.2 MG/ML IJ SOLN
0.1000 mg | Freq: Three times a day (TID) | INTRAMUSCULAR | Status: DC
  Administered 2013-12-07 – 2013-12-08 (×3): 0.1 mg via SUBCUTANEOUS
  Filled 2013-12-07 (×6): qty 0.5

## 2013-12-07 MED ORDER — SCOPOLAMINE 1 MG/3DAYS TD PT72
1.0000 | MEDICATED_PATCH | TRANSDERMAL | Status: DC
  Administered 2013-12-07: 1.5 mg via TRANSDERMAL
  Filled 2013-12-07: qty 1

## 2013-12-07 NOTE — Progress Notes (Signed)
PULMONARY / CRITICAL CARE MEDICINE   Name: Seth West MRN: [id number] DOB: Aug 10, 1940    ADMISSION DATE:  12/21/2013 CONSULTATION DATE:  December 28, 2013  REFERRING MD :  FPTS PRIMARY SERVICE: FPTS  CHIEF COMPLAINT:  Respiratory failure and CVA  BRIEF PATIENT DESCRIPTION: 73 year old male with PMH of mild dementia and HTN, now acute CVA and comfort care GIP status as of 7/13  SIGNIFICANT EVENTS / STUDIES:  7/7 admission for AMS. Dec 29, 2022 MRI with diffuse infarcts.  LINES / TUBES: ETT 7/9> 7/10 R IJ TLC 7/9>>> R radial a-line 7/9> 7/10 Foley 7/9>>> OGT 7/9>>>7/11  CULTURES: Urine 7/7> neg   MRSA swab 7/7>>>Neg.  ANTIBIOTICS: None  SUBJECTIVE:  Comfort goals met on ativan/ms drips Increased secretions   VITAL SIGNS: Temp:  [97.7 F (36.5 C)-100.4 F (38 C)] 97.7 F (36.5 C) (07/16 0549) Pulse Rate:  [124-129] 124 (07/16 0549) Resp:  [5-8] 8 (07/16 0549) BP: (128-133)/(51-52) 128/51 mmHg (07/16 0549) SpO2:  [91 %-92 %] 91 % (07/16 0549)   INTAKE / OUTPUT: Intake/Output     07/15 0701 - 07/16 0700 07/16 0701 - 07/17 0700   I.V.     Total Intake 0     Urine 325    Total Output 325     Net -325            PHYSICAL EXAMINATION: General:  Chronically ill appearing male, completely unresponsive. Neuro:  Unresponsive, does not withdraw to pain,   No gag reflex.  Cardiovascular:  RRR, Nl S1/S2, -M/R/G. Lungs:  Transmitted upper airway sounds. + gurgling Abdomen:  Soft, NT, ND and +BS. Musculoskeletal:  -edema and -tenderness.  R AKA. Skin:  Intact.  LABS:  CBC  Recent Labs Lab 12/01/13 0445  WBC 9.0  HGB 11.4*  HCT 33.6*  PLT 184   Coag's  Recent Labs Lab 12/01/13 0445  INR 1.16   BMET  Recent Labs Lab 12/01/13 0445  NA 139  K 3.5*  CL 103  CO2 20  BUN 17  CREATININE 0.91  GLUCOSE 74   Electrolytes  Recent Labs Lab 12/01/13 0445  CALCIUM 9.3   Sepsis Markers No results found for this basename: LATICACIDVEN, PROCALCITON, O2SATVEN,   in the last 168 hours ABG  Recent Labs Lab 12-28-13 2127  PHART 7.446  PCO2ART 30.7*  PO2ART 134.0*   Liver Enzymes No results found for this basename: AST, ALT, ALKPHOS, BILITOT, ALBUMIN,  in the last 168 hours Cardiac Enzymes No results found for this basename: TROPONINI, PROBNP,  in the last 168 hours Glucose  Recent Labs Lab 12/28/13 2137 Dec 28, 2013 2353 12/01/13 0319 12/01/13 0838 12/01/13 1215 12/01/13 1245  GLUCAP 113* 86 78 71 60* 132*    Imaging No results found.     ASSESSMENT / PLAN: Principal Problem:   Comfort measures only status Active Problems:   Coma   Acute respiratory failure   CVA (cerebral vascular accident)   DNR (do not resuscitate)   PULMONARY A: Acute respiratory failure  P:   -comfort care only  -robinul added  CARDIOVASCULAR A: HTN by history.  Target BP per neuro. P:  - Hold all PO meds.     NEUROLOGIC A:  Multiple strokes, likely showering from a cardiac origin. COMA  GCS 3 P:    NCB/ neuro signed off 7/11 Comfort care only    No family at bedside  Cont GIP status.  Hospice following.    Nickolas Madrid, NP 12/07/2013  1:31 PM Pager: [phone]  or [phone]  I have personally obtained history, examined patient, evaluated and interpreted laboratory and imaging results, reviewed medical records, formulated assessment / plan and placed orders.  Doree Fudge, MD Pulmonary and [facility] Pager: [phone]  12/07/2013, 2:19 PM

## 2013-12-07 NOTE — Progress Notes (Addendum)
Inpatient RN visit- [facility] RN Visit-Karen Merilynn FinlandRobertson RN  [facility]. Pt is DNR code.   Pt seen at bedside, facial muscles relaxed, jaw slack. Respirations 8 w/15 sec periods of  apnea, skin cool to touc, no fever, no audible secretions. Patient appears comfortable, remains on continuous morphine and ativan infusions for management of pain/discomfort and anxiety. Per chart review patient has recvd atropine gtts for management of terminal secretions overnight.  Patient's daughter Delray AltMargie in during visit, shared that she "is a daddy's girl" and is still is holding out some small hope that he may recover, at the same time she also stated that "if God chooses to take him, I will be alright". Emotional support offered.  No family present at time of visit.  [facility] will continue to collaborate with staff and attending Md r/t symptom management and patient needs through end of life.  2:00 pm-Audible terminal secretions heard from outside of patient's room, pt seen at bedside with increased oral secretions, increased RR, agitation, very warm to touch. Alerted staff RN Terri who administered lorazepam 5mg  bolus per orders, Terri reported atropine had been given just prior to RN visit. Patient repositioned for comfort, and oral suctioning performed by staff RN. Attending Md in during visit, noted increased secretions and Gylcopyrrolate 0.1mg  Taylor TID  added for management. Staff RN administered acetaminophen suppository as ordered for increased temp of 102.8 ax. Pt resting, appeared more comfortable after interventions. Discussed use of bolus doses for management of increased RR and agitation with staff RN Terri voiced her understanding and comfort with this. Please call [facility] @ [phone]  with any hospice needs or at time of death.   Thank you. Hansel StarlingKaren E. Robertson, RN  [facility]  Hospice Liaison  [phone])

## 2013-12-08 ENCOUNTER — Encounter (HOSPITAL_COMMUNITY): Payer: Self-pay | Admitting: *Deleted

## 2013-12-08 DIAGNOSIS — I6350 Cerebral infarction due to unspecified occlusion or stenosis of unspecified cerebral artery: Principal | ICD-10-CM

## 2013-12-23 NOTE — Progress Notes (Signed)
PULMONARY / CRITICAL CARE MEDICINE   Name: Seth West MRN: [id number] DOB: March 24, 1941    ADMISSION DATE:  12/07/2013 CONSULTATION DATE:  11/30/2013  PRIMARY SERVICE: PCCM  CHIEF COMPLAINT:  Respiratory failure and CVA  BRIEF PATIENT DESCRIPTION: 73 year old male with PMH of mild dementia and HTN, now acute CVA and comfort care GIP status as of 7/13  SIGNIFICANT EVENTS / STUDIES:  7/7  Admitted for acute encephalopathy 7/9  MRI >>> diffuse infarcts.  LINES / TUBES: ETT 7/9 >>> 7/10 R IJ TLC 7/9 >>> R radial a-line 7/9 >>> 7/10 Foley 7/9 >>> OGT 7/9 >>> 7/11  CULTURES: 7/7  Urine 7/7 >>> neg  7/7  MRSA PCR 7/7 >>> neg  ANTIBIOTICS:  INTERVAL HISTORY: No acute overnight events  VITAL SIGNS: Temp:  [101.2 F (38.4 C)] 101.2 F (38.4 C) (07/17 0516) Pulse Rate:  [136] 136 (07/17 0516) BP: (114)/(45) 114/45 mmHg (07/17 0516) SpO2:  [85 %] 85 % (07/17 0516) Weight:  [69.2 kg (152 lb 8.9 oz)] 69.2 kg (152 lb 8.9 oz) (07/17 0700)   INTAKE / OUTPUT: Intake/Output     07/16 0701 - 07/17 0700 07/17 0701 - 07/18 0700   I.V. (mL/kg) 20 (0.3) 20 (0.3)   Total Intake(mL/kg) 20 (0.3) 20 (0.3)   Urine (mL/kg/hr) 475 (0.3)    Total Output 475     Net -455 +20          PHYSICAL EXAMINATION: General:  No distress Neuro:  Obtunded Cardiovascular:  Regular Lungs:  Rhonchi Abdomen:  Soft Musculoskeletal:  R AKA Skin:  Intact.  LABS:  CBC No results found for this basename: WBC, HGB, HCT, PLT,  in the last 168 hours Coag's No results found for this basename: APTT, INR,  in the last 168 hours BMET No results found for this basename: NA, K, CL, CO2, BUN, CREATININE, GLUCOSE,  in the last 168 hours Electrolytes No results found for this basename: CALCIUM, MG, PHOS,  in the last 168 hours Sepsis Markers No results found for this basename: LATICACIDVEN, PROCALCITON, O2SATVEN,  in the last 168 hours ABG No results found for this basename: PHART, PCO2ART, PO2ART,  in the  last 168 hours Liver Enzymes No results found for this basename: AST, ALT, ALKPHOS, BILITOT, ALBUMIN,  in the last 168 hours Cardiac Enzymes No results found for this basename: TROPONINI, PROBNP,  in the last 168 hours Glucose  Recent Labs Lab 12/01/13 1215 12/01/13 1245  GLUCAP 60* 132*   IMAGING:  No results found.  ASSESSMENT / PLAN:  CVA Acute respiratory failure Acute encephalopathy DNR / DMI / comfort measures only status   Morphine / Ativan gtt  Robimul  Hospice following  I have personally obtained history, examined patient, evaluated and interpreted laboratory and imaging results, reviewed medical records, formulated assessment / plan and placed orders.  Lonia FarberZUBELEVITSKIY, Haydon Kalmar, MD Pulmonary and Critical Care Medicine [facility] Pager: [phone]  2013-06-30, 10:21 AM

## 2013-12-23 NOTE — Progress Notes (Signed)
Inpatient RN visit- [facility] Visit-Karen Merilynn FinlandRobertson RN Related [facility].  Pt is DNR code.   Pt seen at bedside, respirations shallow, some upper airway congestion noted. Apnea of 15-20 seconds. Pt very warm to touch, staff RN Terri in to assess and gave acetaminophen suppository for T of 104.8 ax. Pt repositioned with staff RN. Secretions subsided with repositioning. Per chart review pt has been receiving the PRN atropine as ordered for increased secretions. Pt remains on continuous Morphine and Lorazepam infusion for management of pain/discomfort/anxiety. No s/s of pain or discomfort, no grimace, or moaning, facial muscles relaxed.  Pt's daughter Delray AltMargie in during visit. She expressed her concern that he father "may be starving". Education provided regarding benefits/burdens of tube feeding at this point in the dying process. [name] voiced her understanding and appreciation for the discussion. She also shared again her love for her father, their close bond and how she important it is to her to be with him at the time of his death. Education provided on changes noted in breathing pattern, which became more shallow during visit.  [facility] will continue to collaborate with staff and attending Md Dr. Delford FieldWright r/t symptom management and patient needs through end of life.   Please call [facility] @ [phone]  with any hospice needs or at time of death.   Thank you. Hansel StarlingKaren E. Robertson, RN  [facility]  Hospice Liaison  [phone])

## 2013-12-23 NOTE — Progress Notes (Signed)
Hospice notified of death.

## 2013-12-23 NOTE — Progress Notes (Signed)
1457 pt expired, md notified, daughter & [place], family verified funeral home & no belongings were left with patient.  Morphine waste 60cc, ativan waste 30cc in sink, witness Lynett FishVasie Cain RN witnessed.

## 2013-12-23 DEATH — deceased

## 2013-12-29 NOTE — Discharge Summary (Signed)
Name: Seth West MRN: [id number] DOB: Jan 03, 1941  PCCM DEATH NOTE  Time of death:  12/22/2013 2:57 PM  Cause of death: CVA  Discharge diagnoses: CVA  Acute respiratory failure  Acute encephalopathy  Brief hospital course:  73 year old male with PMH of mild dementia and HTN admitted with acute CVA complicated by respiratory failure.  The patient's condition, hospital course, ongoing treatment and prognosis were discussed with the family.  Questions were answered. The consensus was reached that following patient's wishes no cardiopulmonary resuscitation should be attempted and comfort measures should be pursued.  Life support was withdrawn and comfort measures provided.  Patient expired shortly after.  No resuscitation was attempted.  Lonia FarberZUBELEVITSKIY, Chanc Kervin, MD Pulmonary and Critical Care Medicine [facility] Pager: [phone]

## 2014-12-15 IMAGING — CT CT HEAD W/O CM
2 series · 15 of 30 positions shown, 19 images · non-contrast
Comparison: None.

CLINICAL DATA: Altered mental status.  Hypertension.  Dementia.

EXAM:
CT HEAD WITHOUT CONTRAST
TECHNIQUE: Contiguous axial images were obtained from the base of the skull
through the vertex without intravenous contrast.

[Series 201: head w/o, idose (1) · axial · non-contrast · 0.49mm/px · z∈[+106,+231]mm · 13 of 31 slices shown, 17 images]
[im 3/31  brain]
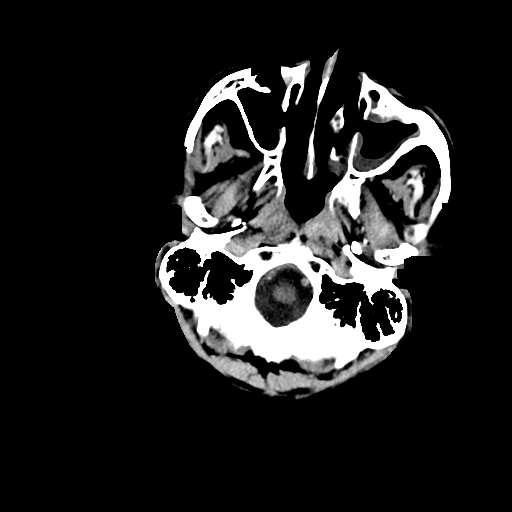
[im 3/31  bone]
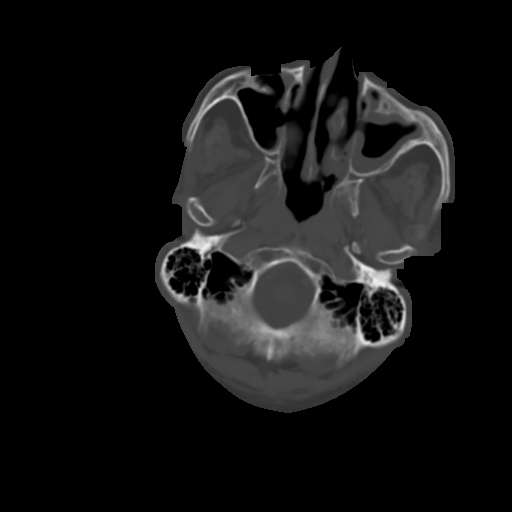
[im 5/31  brain]
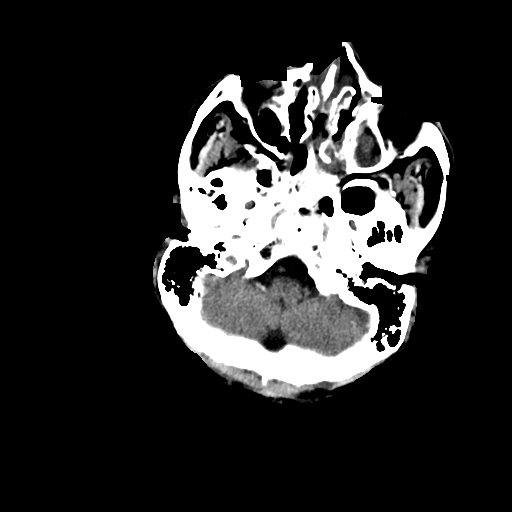
[im 7/31  brain]
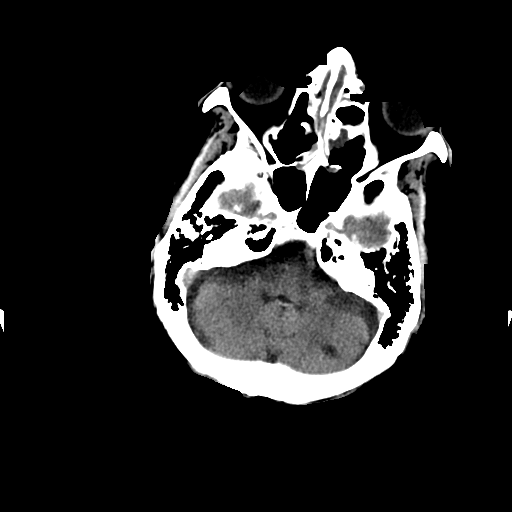
[im 9/31  brain]
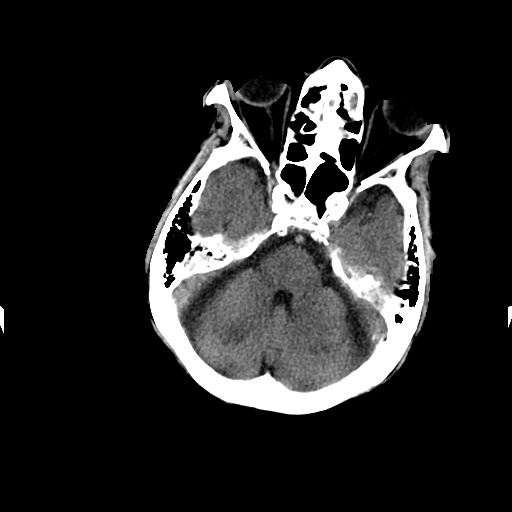
[im 11/31  brain]
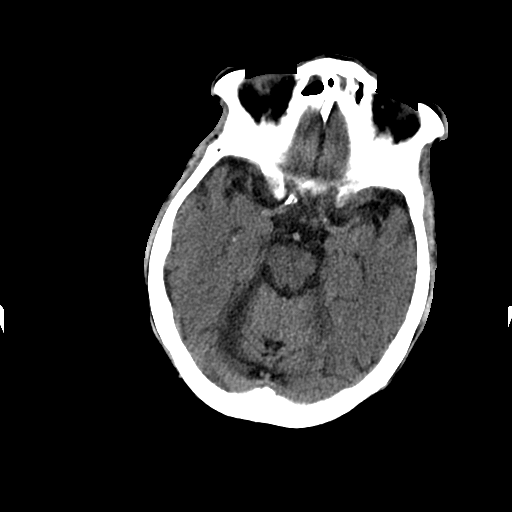
[im 11/31  bone]
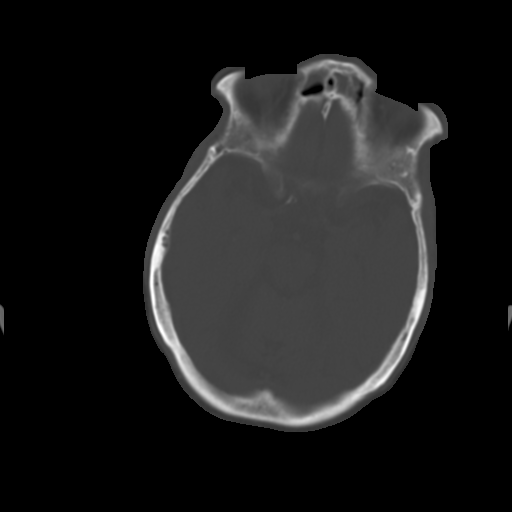
[im 13/31  brain]
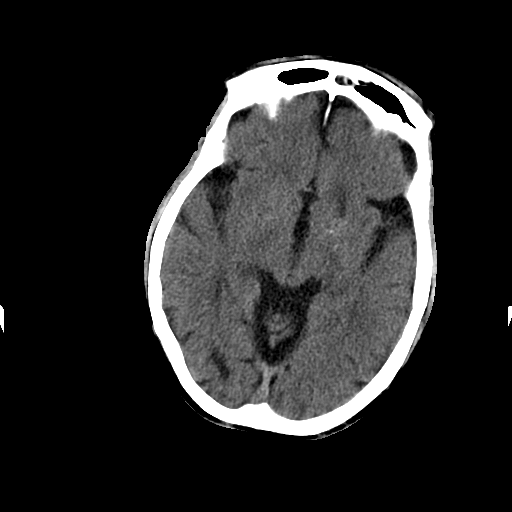
[im 16/31  brain]
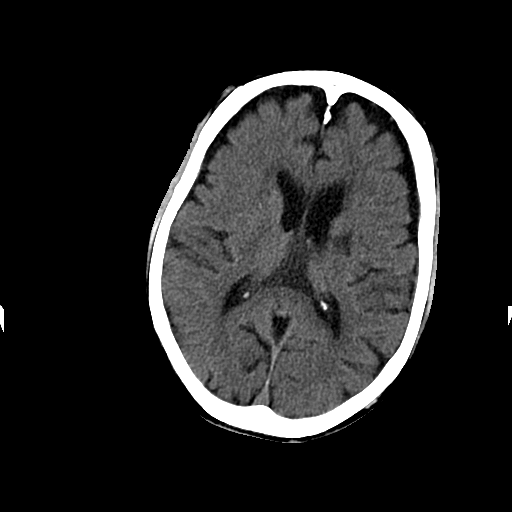
[im 18/31  brain]
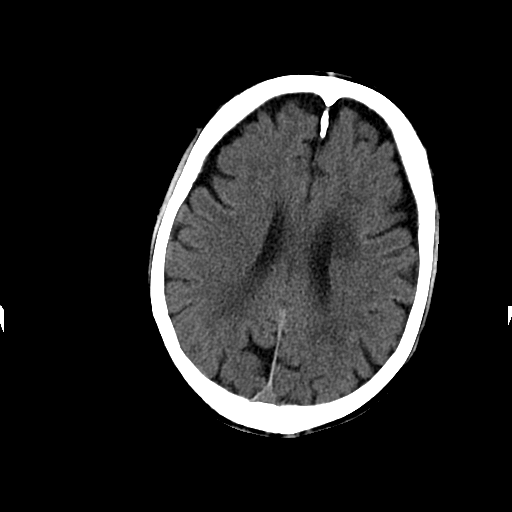
[im 20/31  brain]
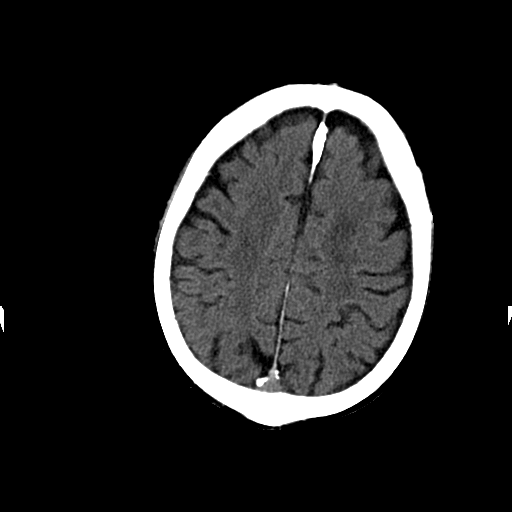
[im 20/31  bone]
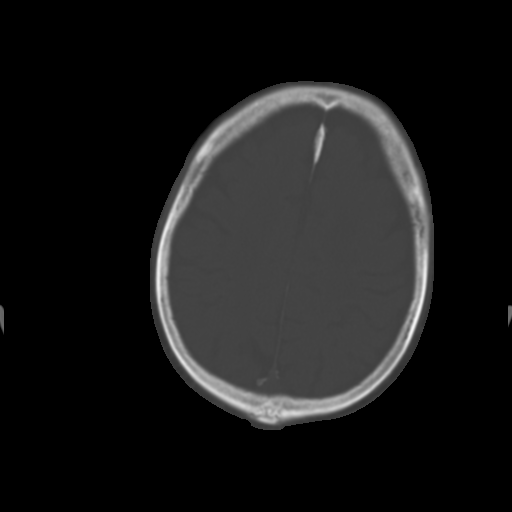
[im 22/31  brain]
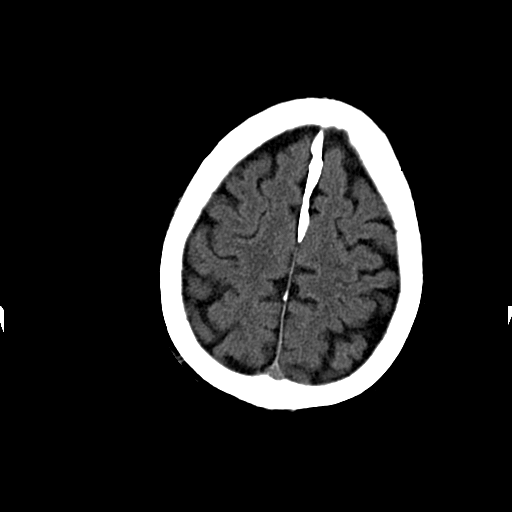
[im 24/31  brain]
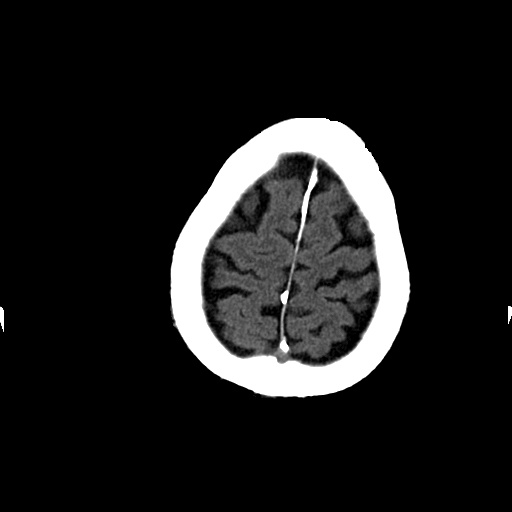
[im 26/31  brain]
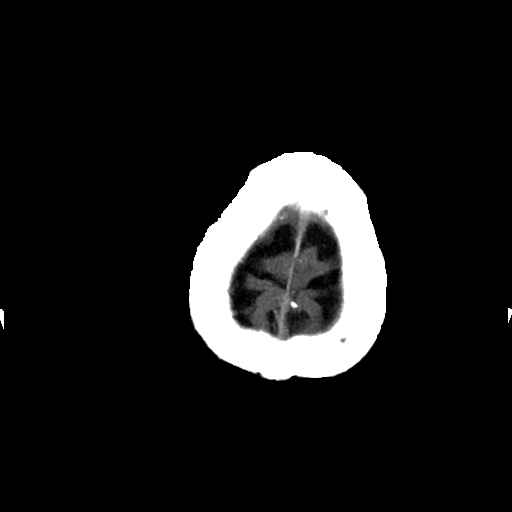
[im 28/31  brain]
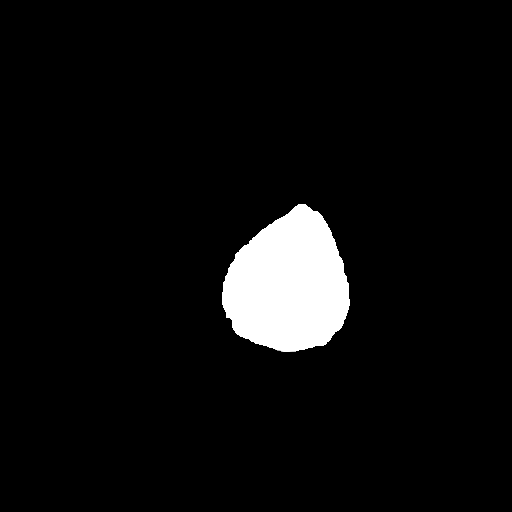
[im 28/31  bone]
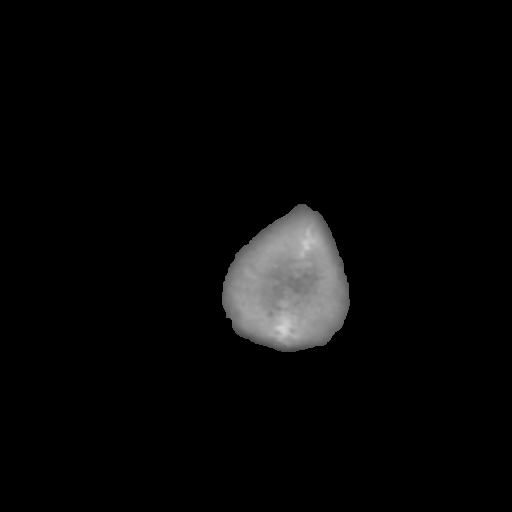

[Series 202: head w/o bone, idose (1) · axial · non-contrast · 0.49mm/px · z∈[+106,+126]mm · 2 of 32 slices shown]
[im 3/32  bone]
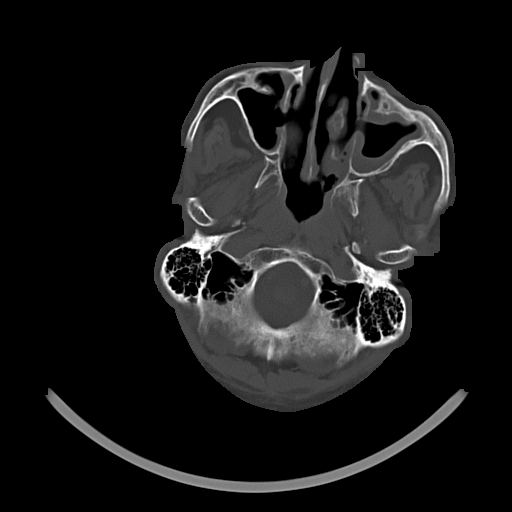
[im 7/32  bone]
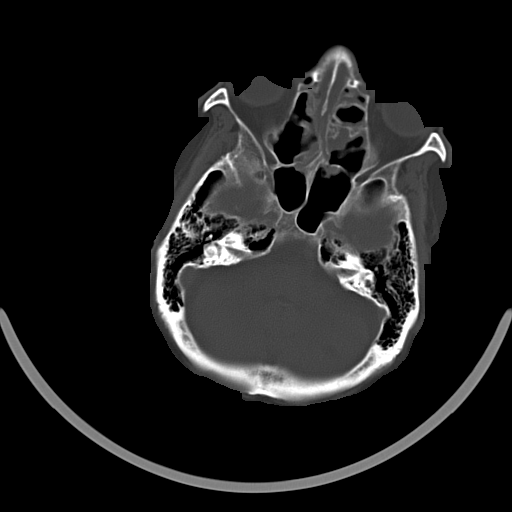

[15 of 30 positions shown; findings below may reference images not displayed]

FINDINGS: No intracranial hemorrhage.

Remote basal ganglia infarcts bilaterally. Remote left corona
radiata infarct with encephalomalacia. Remote small left cerebellar
infarct. No CT evidence of large acute infarct.

Mineralization globus pallidus.

Global atrophy without hydrocephalus.

No intracranial mass lesion noted on this unenhanced exam.

Vascular calcifications.

Opacification inferior medial frontal sinuses and ethmoid sinus air
cells with mucosal thickening anterior aspect of the sphenoid sinus
air cells. Moderate mucosal thickening maxillary sinuses with
evidence of prior maxillary sinus surgery.
IMPRESSION: Remote infarcts and small vessel disease type changes without CT
evidence of large acute infarct.

Atrophy without hydrocephalus.

Vascular calcifications.

Paranasal sinus mucosal thickening/ partial opacification as noted
above.

## 2014-12-18 IMAGING — CR DG CHEST 1V PORT
1 series · 1 of 1 positions shown · non-contrast
Comparison: One-view chest 11/30/2013.

CLINICAL DATA: Endotracheal tube placement.

EXAM:
PORTABLE CHEST - 1 VIEW

[AP]
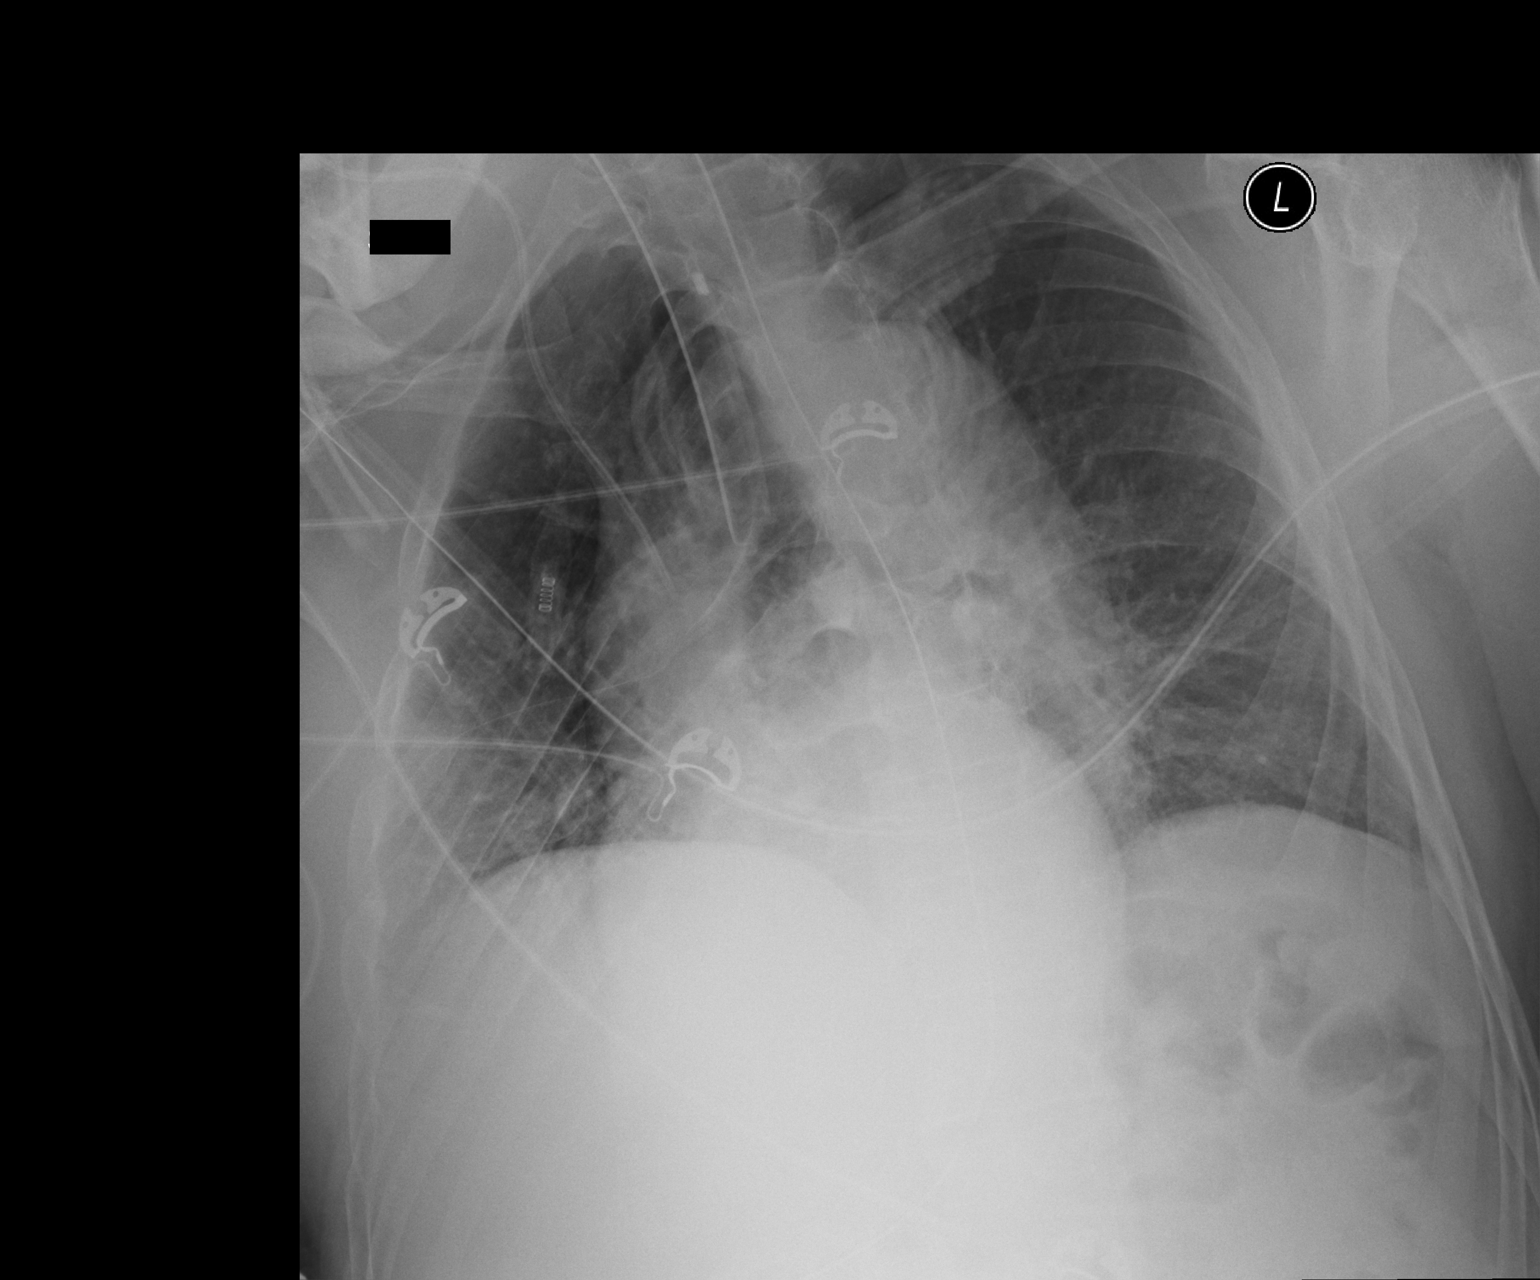

[1 of 1 positions shown; findings below may reference images not displayed]

FINDINGS: Endotracheal tube remains low, 1.5 cm above the carina. It could be
retracted 2-3 cm for more optimal positioning. A right IJ line is
stable. The patient is significantly rotated to the right. The NG
tube courses off the inferior border of the film. Mild bibasilar
airspace disease likely reflects atelectasis.
IMPRESSION: 1. The endotracheal tube remains low, 1.5 cm above the carina. The
could be retracted 2-3 cm for more optimal positioning.
2. Persistent low lung volumes and mild bibasilar atelectasis.
